# Patient Record
Sex: Male | Born: 1935
Health system: Southern US, Community
[De-identification: ages and names within clinical notes are randomized; demographics above are authoritative.]

## PROBLEM LIST (undated history)

## (undated) DIAGNOSIS — I2510 Atherosclerotic heart disease of native coronary artery without angina pectoris: Secondary | ICD-10-CM

## (undated) DIAGNOSIS — M545 Low back pain, unspecified: Secondary | ICD-10-CM

## (undated) DIAGNOSIS — I10 Essential (primary) hypertension: Secondary | ICD-10-CM

## (undated) DIAGNOSIS — Z87442 Personal history of urinary calculi: Secondary | ICD-10-CM

## (undated) DIAGNOSIS — K219 Gastro-esophageal reflux disease without esophagitis: Secondary | ICD-10-CM

## (undated) DIAGNOSIS — H9319 Tinnitus, unspecified ear: Secondary | ICD-10-CM

## (undated) DIAGNOSIS — N451 Epididymitis: Secondary | ICD-10-CM

## (undated) DIAGNOSIS — R609 Edema, unspecified: Secondary | ICD-10-CM

## (undated) DIAGNOSIS — M1990 Unspecified osteoarthritis, unspecified site: Secondary | ICD-10-CM

## (undated) DIAGNOSIS — E7800 Pure hypercholesterolemia, unspecified: Secondary | ICD-10-CM

## (undated) DIAGNOSIS — R600 Localized edema: Secondary | ICD-10-CM

## (undated) DIAGNOSIS — K911 Postgastric surgery syndromes: Secondary | ICD-10-CM

## (undated) DIAGNOSIS — K439 Ventral hernia without obstruction or gangrene: Secondary | ICD-10-CM

## (undated) DIAGNOSIS — K5790 Diverticulosis of intestine, part unspecified, without perforation or abscess without bleeding: Secondary | ICD-10-CM

## (undated) DIAGNOSIS — I219 Acute myocardial infarction, unspecified: Secondary | ICD-10-CM

## (undated) DIAGNOSIS — L309 Dermatitis, unspecified: Secondary | ICD-10-CM

## (undated) DIAGNOSIS — D126 Benign neoplasm of colon, unspecified: Secondary | ICD-10-CM

## (undated) DIAGNOSIS — R1310 Dysphagia, unspecified: Secondary | ICD-10-CM

## (undated) DIAGNOSIS — N529 Male erectile dysfunction, unspecified: Secondary | ICD-10-CM

## (undated) DIAGNOSIS — M5090 Cervical disc disorder, unspecified, unspecified cervical region: Secondary | ICD-10-CM

## (undated) DIAGNOSIS — N400 Enlarged prostate without lower urinary tract symptoms: Secondary | ICD-10-CM

## (undated) DIAGNOSIS — N200 Calculus of kidney: Secondary | ICD-10-CM

## (undated) DIAGNOSIS — Z9889 Other specified postprocedural states: Secondary | ICD-10-CM

## (undated) HISTORY — PX: ANTRECTOMY: SHX5722

## (undated) HISTORY — PX: BACK SURGERY: SHX140

## (undated) HISTORY — DX: Tinnitus, unspecified ear: H93.19

## (undated) HISTORY — DX: Postgastric surgery syndromes: K91.1

## (undated) HISTORY — DX: Benign neoplasm of colon, unspecified: D12.6

## (undated) HISTORY — DX: Low back pain: M54.5

## (undated) HISTORY — DX: Unspecified osteoarthritis, unspecified site: M19.90

## (undated) HISTORY — DX: Diverticulosis of intestine, part unspecified, without perforation or abscess without bleeding: K57.90

## (undated) HISTORY — DX: Localized edema: R60.0

## (undated) HISTORY — DX: Other specified postprocedural states: Z98.890

## (undated) HISTORY — PX: OTHER SURGICAL HISTORY: SHX169

## (undated) HISTORY — DX: Dysphagia, unspecified: R13.10

## (undated) HISTORY — DX: Benign prostatic hyperplasia without lower urinary tract symptoms: N40.0

## (undated) HISTORY — DX: Low back pain, unspecified: M54.50

## (undated) HISTORY — DX: Calculus of kidney: N20.0

## (undated) HISTORY — DX: Ventral hernia without obstruction or gangrene: K43.9

## (undated) HISTORY — DX: Cervical disc disorder, unspecified, unspecified cervical region: M50.90

## (undated) HISTORY — DX: Male erectile dysfunction, unspecified: N52.9

## (undated) HISTORY — PX: CHOLECYSTECTOMY: SHX55

## (undated) HISTORY — PX: HEMORRHOID SURGERY: SHX153

## (undated) HISTORY — DX: Epididymitis: N45.1

## (undated) HISTORY — DX: Dermatitis, unspecified: L30.9

## (undated) HISTORY — PX: CERVICAL DISC SURGERY: SHX588

## (undated) HISTORY — DX: Atherosclerotic heart disease of native coronary artery without angina pectoris: I25.10

## (undated) HISTORY — DX: Pure hypercholesterolemia, unspecified: E78.00

## (undated) HISTORY — DX: Edema, unspecified: R60.9

## (undated) HISTORY — PX: APPENDECTOMY: SHX54

## (undated) HISTORY — PX: TONSILLECTOMY: SUR1361

## (undated) HISTORY — DX: Essential (primary) hypertension: I10

---

## 1998-09-20 ENCOUNTER — Inpatient Hospital Stay (HOSPITAL_COMMUNITY): Admission: EM | Admit: 1998-09-20 | Discharge: 1998-09-23 | Payer: Self-pay | Admitting: Emergency Medicine

## 2000-07-11 ENCOUNTER — Ambulatory Visit (HOSPITAL_COMMUNITY): Admission: RE | Admit: 2000-07-11 | Discharge: 2000-07-11 | Payer: Self-pay | Admitting: Family Medicine

## 2000-07-11 ENCOUNTER — Encounter: Payer: Self-pay | Admitting: Family Medicine

## 2001-08-11 ENCOUNTER — Encounter (INDEPENDENT_AMBULATORY_CARE_PROVIDER_SITE_OTHER): Payer: Self-pay | Admitting: Specialist

## 2001-08-11 ENCOUNTER — Ambulatory Visit (HOSPITAL_COMMUNITY): Admission: RE | Admit: 2001-08-11 | Discharge: 2001-08-11 | Payer: Self-pay | Admitting: Gastroenterology

## 2005-04-02 ENCOUNTER — Encounter: Admission: RE | Admit: 2005-04-02 | Discharge: 2005-04-04 | Payer: Self-pay | Admitting: Orthopedic Surgery

## 2005-05-08 ENCOUNTER — Ambulatory Visit (HOSPITAL_COMMUNITY): Admission: RE | Admit: 2005-05-08 | Discharge: 2005-05-08 | Payer: Self-pay | Admitting: Orthopedic Surgery

## 2005-07-30 ENCOUNTER — Encounter (INDEPENDENT_AMBULATORY_CARE_PROVIDER_SITE_OTHER): Payer: Self-pay | Admitting: *Deleted

## 2005-07-30 ENCOUNTER — Ambulatory Visit (HOSPITAL_COMMUNITY): Admission: RE | Admit: 2005-07-30 | Discharge: 2005-07-30 | Payer: Self-pay | Admitting: Gastroenterology

## 2005-11-22 ENCOUNTER — Ambulatory Visit (HOSPITAL_COMMUNITY): Admission: RE | Admit: 2005-11-22 | Discharge: 2005-11-22 | Payer: Self-pay | Admitting: Orthopedic Surgery

## 2006-05-12 ENCOUNTER — Encounter: Admission: RE | Admit: 2006-05-12 | Discharge: 2006-05-12 | Payer: Self-pay | Admitting: Gastroenterology

## 2006-05-13 ENCOUNTER — Encounter: Admission: RE | Admit: 2006-05-13 | Discharge: 2006-05-13 | Payer: Self-pay | Admitting: Gastroenterology

## 2006-05-29 ENCOUNTER — Inpatient Hospital Stay (HOSPITAL_COMMUNITY): Admission: RE | Admit: 2006-05-29 | Discharge: 2006-05-30 | Payer: Self-pay | Admitting: Neurological Surgery

## 2006-06-24 ENCOUNTER — Encounter: Admission: RE | Admit: 2006-06-24 | Discharge: 2006-06-24 | Payer: Self-pay | Admitting: Neurological Surgery

## 2006-08-18 ENCOUNTER — Encounter: Admission: RE | Admit: 2006-08-18 | Discharge: 2006-08-18 | Payer: Self-pay | Admitting: Neurological Surgery

## 2007-05-13 ENCOUNTER — Encounter: Admission: RE | Admit: 2007-05-13 | Discharge: 2007-05-13 | Payer: Self-pay | Admitting: Gastroenterology

## 2007-06-01 ENCOUNTER — Encounter: Admission: RE | Admit: 2007-06-01 | Discharge: 2007-06-01 | Payer: Self-pay | Admitting: Family Medicine

## 2007-12-10 HISTORY — PX: OTHER SURGICAL HISTORY: SHX169

## 2008-03-08 ENCOUNTER — Encounter: Admission: RE | Admit: 2008-03-08 | Discharge: 2008-03-08 | Payer: Self-pay | Admitting: Neurological Surgery

## 2008-04-08 ENCOUNTER — Encounter: Admission: RE | Admit: 2008-04-08 | Discharge: 2008-04-08 | Payer: Self-pay | Admitting: Neurological Surgery

## 2008-04-22 ENCOUNTER — Observation Stay (HOSPITAL_COMMUNITY): Admission: RE | Admit: 2008-04-22 | Discharge: 2008-04-24 | Payer: Self-pay | Admitting: Neurological Surgery

## 2008-05-03 ENCOUNTER — Encounter: Admission: RE | Admit: 2008-05-03 | Discharge: 2008-05-03 | Payer: Self-pay | Admitting: Neurological Surgery

## 2008-05-16 ENCOUNTER — Encounter: Admission: RE | Admit: 2008-05-16 | Discharge: 2008-06-22 | Payer: Self-pay | Admitting: Neurological Surgery

## 2008-07-26 ENCOUNTER — Encounter: Admission: RE | Admit: 2008-07-26 | Discharge: 2008-07-26 | Payer: Self-pay | Admitting: Neurological Surgery

## 2008-09-11 ENCOUNTER — Ambulatory Visit (HOSPITAL_COMMUNITY): Admission: RE | Admit: 2008-09-11 | Discharge: 2008-09-11 | Payer: Self-pay | Admitting: Neurological Surgery

## 2008-09-14 ENCOUNTER — Encounter: Admission: RE | Admit: 2008-09-14 | Discharge: 2008-09-14 | Payer: Self-pay | Admitting: Neurological Surgery

## 2008-09-23 ENCOUNTER — Encounter: Admission: RE | Admit: 2008-09-23 | Discharge: 2008-09-23 | Payer: Self-pay | Admitting: Neurological Surgery

## 2008-10-05 ENCOUNTER — Inpatient Hospital Stay (HOSPITAL_COMMUNITY): Admission: RE | Admit: 2008-10-05 | Discharge: 2008-10-31 | Payer: Self-pay | Admitting: Neurological Surgery

## 2008-10-22 ENCOUNTER — Ambulatory Visit: Payer: Self-pay | Admitting: Infectious Diseases

## 2008-11-21 ENCOUNTER — Encounter: Admission: RE | Admit: 2008-11-21 | Discharge: 2008-11-21 | Payer: Self-pay | Admitting: Neurological Surgery

## 2008-11-29 ENCOUNTER — Ambulatory Visit: Payer: Self-pay | Admitting: Internal Medicine

## 2008-11-29 DIAGNOSIS — Q762 Congenital spondylolisthesis: Secondary | ICD-10-CM | POA: Insufficient documentation

## 2008-11-29 DIAGNOSIS — R300 Dysuria: Secondary | ICD-10-CM | POA: Insufficient documentation

## 2008-11-29 DIAGNOSIS — T8140XA Infection following a procedure, unspecified, initial encounter: Secondary | ICD-10-CM

## 2008-11-29 DIAGNOSIS — Z9889 Other specified postprocedural states: Secondary | ICD-10-CM

## 2008-11-29 DIAGNOSIS — IMO0002 Reserved for concepts with insufficient information to code with codable children: Secondary | ICD-10-CM

## 2008-12-12 ENCOUNTER — Telehealth: Payer: Self-pay | Admitting: Internal Medicine

## 2009-01-12 ENCOUNTER — Ambulatory Visit: Payer: Self-pay | Admitting: Internal Medicine

## 2009-01-12 LAB — CONVERTED CEMR LAB
CRP: 0.2 mg/dL (ref <0.6)
Sed Rate: 37 mm/hr — ABNORMAL HIGH (ref 0–16)

## 2009-02-06 ENCOUNTER — Encounter: Admission: RE | Admit: 2009-02-06 | Discharge: 2009-02-06 | Payer: Self-pay | Admitting: Neurological Surgery

## 2009-03-14 ENCOUNTER — Ambulatory Visit: Payer: Self-pay | Admitting: Internal Medicine

## 2009-03-14 LAB — CONVERTED CEMR LAB
CRP: 0.1 mg/dL (ref <0.6)
Sed Rate: 15 mm/hr (ref 0–16)

## 2009-04-10 ENCOUNTER — Encounter: Admission: RE | Admit: 2009-04-10 | Discharge: 2009-04-10 | Payer: Self-pay | Admitting: Neurological Surgery

## 2009-07-10 ENCOUNTER — Encounter: Admission: RE | Admit: 2009-07-10 | Discharge: 2009-07-10 | Payer: Self-pay | Admitting: Neurological Surgery

## 2010-02-25 IMAGING — RF DG LUMBAR SPINE 2-3V
1 series · 2 of 2 positions shown · non-contrast
Comparison: CT myelogram 09/23/2008

CLINICAL DATA: Back pain

LUMBAR SPINE - 2-3 VIEW

[Series 1: run · 2 of 2 slices shown]
[im 1/2]
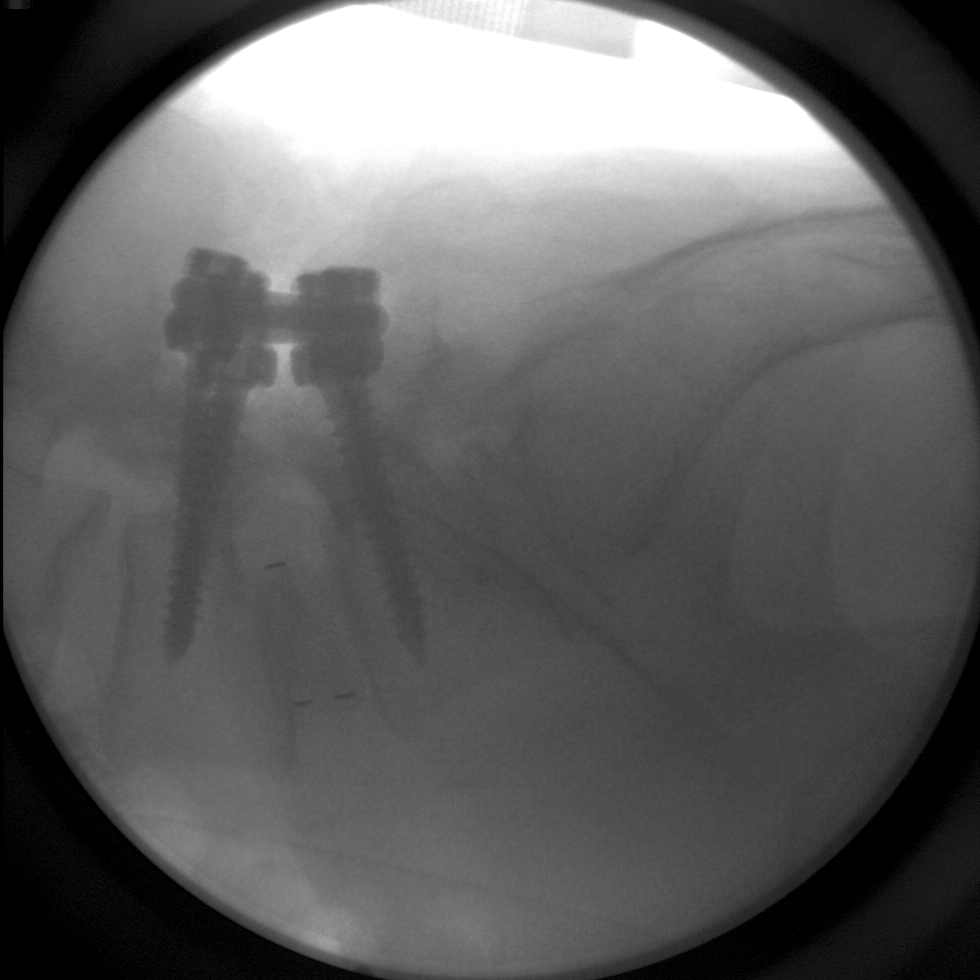
[im 2/2]
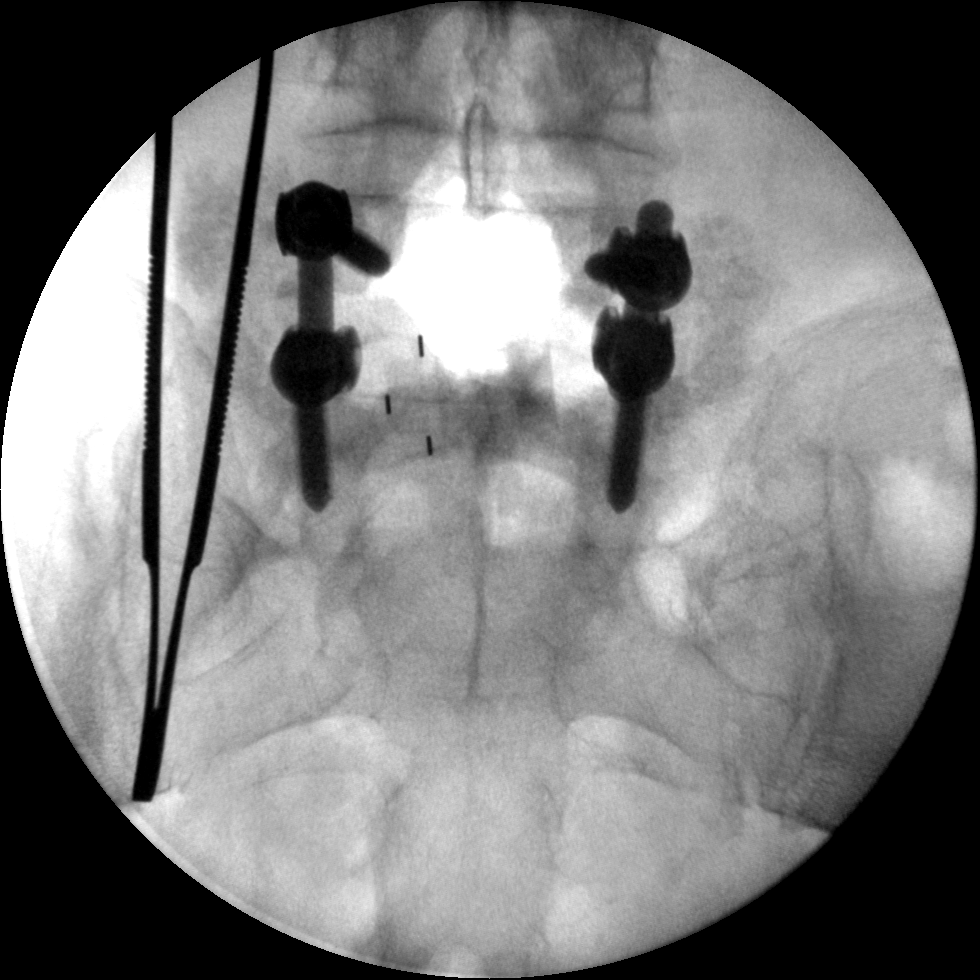

[2 of 2 positions shown; findings below may reference images not displayed]

FINDINGS: AP and lateral intraoperative spot films demonstrate L4-5
PLIF.  Satisfactory position and alignment.
IMPRESSION: As above

## 2010-03-06 IMAGING — RF DG MYELOGRAM LUMBAR
12 series · 12 of 12 positions shown · non-contrast
Comparison: 09/23/2008.
COMPARISON: The present examination incorporates from the mid T8 to
the sacrum.

CLINICAL DATA: History of fusion L4-5.  Dural leak which has been
repaired but with continued drainage and headache.
TECHNIQUE: CT imaging of the lumbar spine was performed after
intrathecal contrast administration.  Multiplanar CT image
reconstructions were also generated.

[Series 1: run · 1 of 1 slices shown (1 of 12)]
[im 1/1]
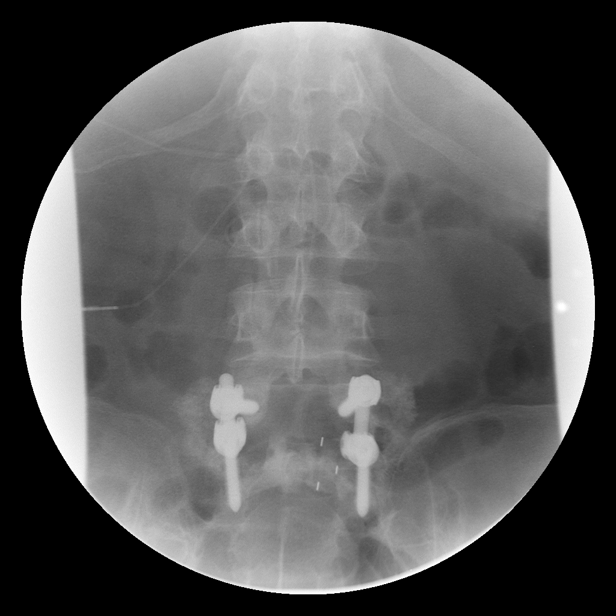

[Series 2: run · 1 of 1 slices shown (2 of 12)]
[im 1/1]
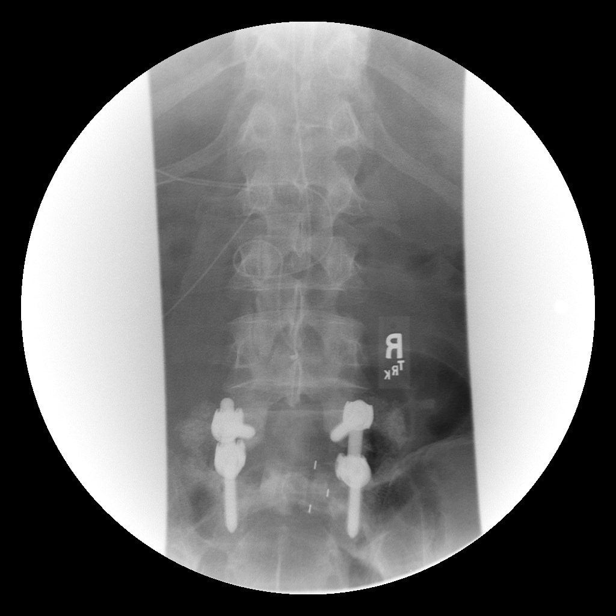

[Series 3: run · 1 of 1 slices shown (3 of 12)]
[im 1/1]
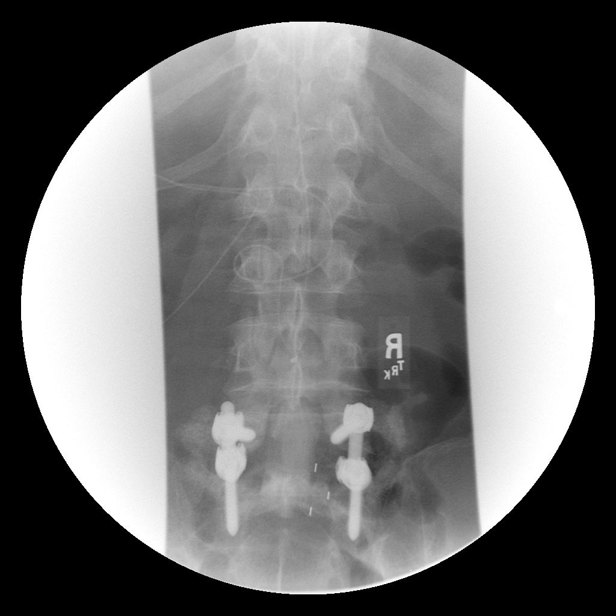

[Series 4: run · 1 of 1 slices shown (4 of 12)]
[im 1/1]
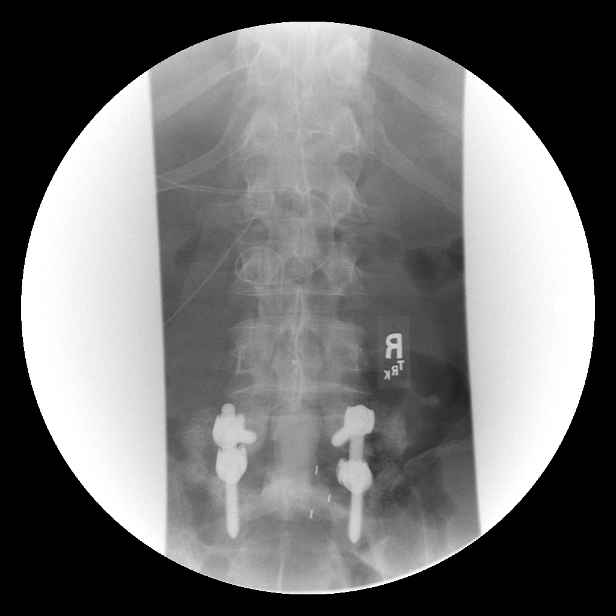

[Series 5: run · 1 of 1 slices shown (5 of 12)]
[im 1/1]
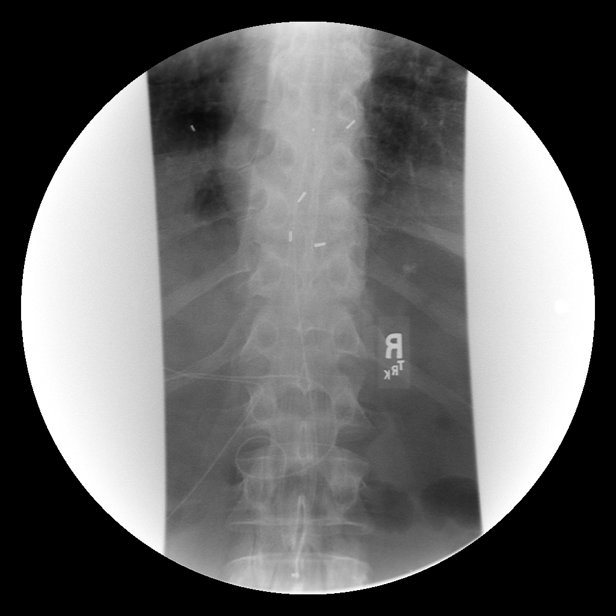

[Series 6: run · 1 of 1 slices shown (6 of 12)]
[im 1/1]
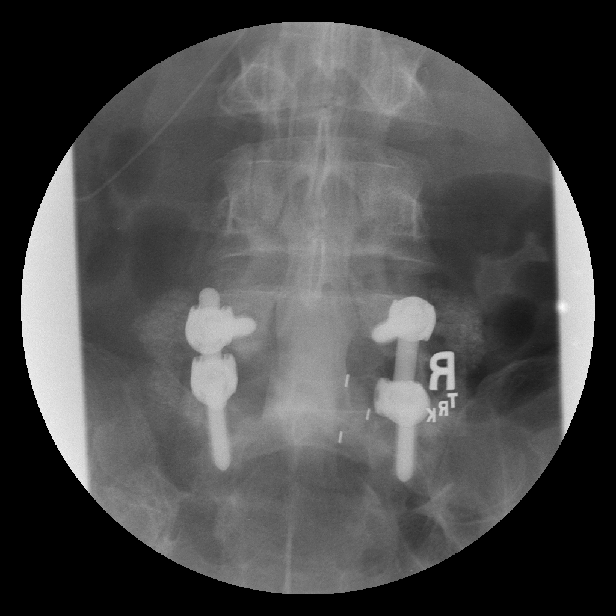

[Series 7: run · 1 of 1 slices shown (7 of 12)]
[im 1/1]
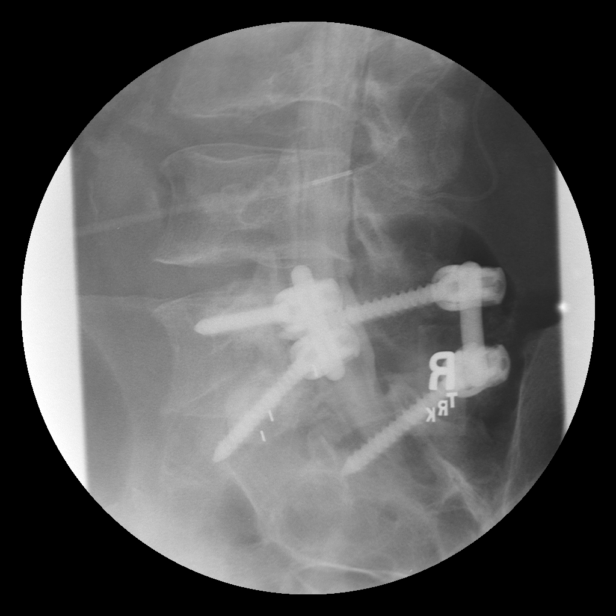

[Series 8: run · 1 of 1 slices shown (8 of 12)]
[im 1/1]
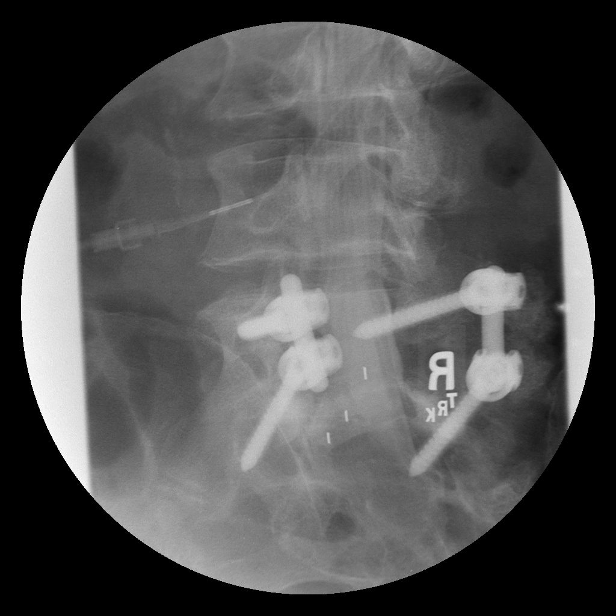

[Series 9: run · 1 of 1 slices shown (9 of 12)]
[im 1/1]
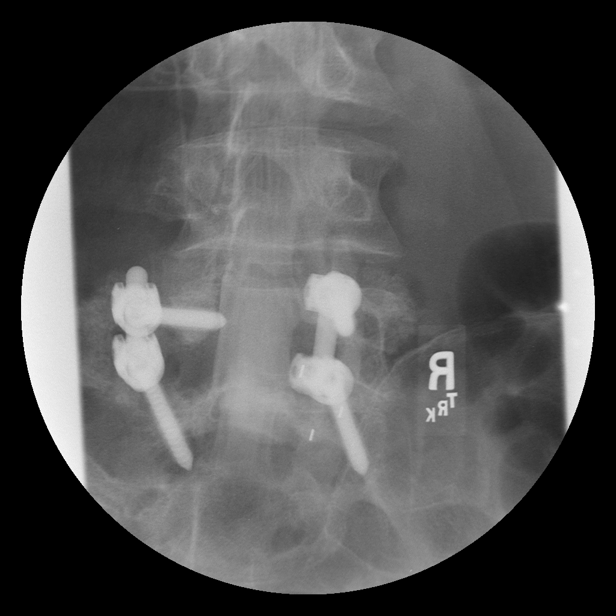

[Series 10: run · 1 of 1 slices shown (10 of 12)]
[im 1/1]
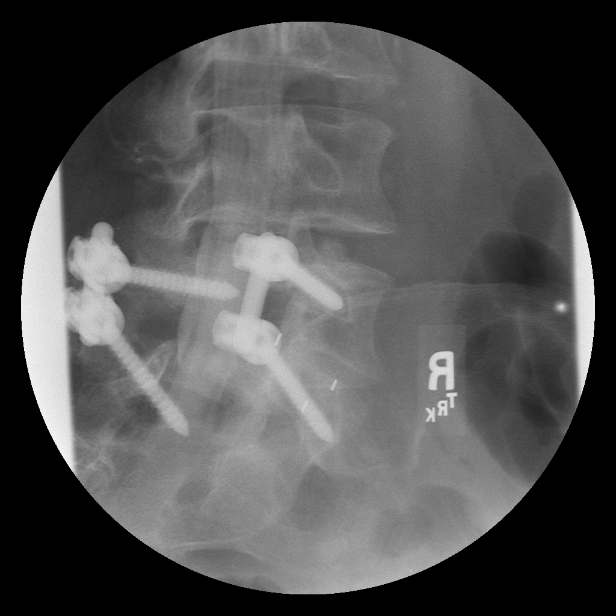

[Series 11: run · 1 of 1 slices shown (11 of 12)]
[im 1/1]
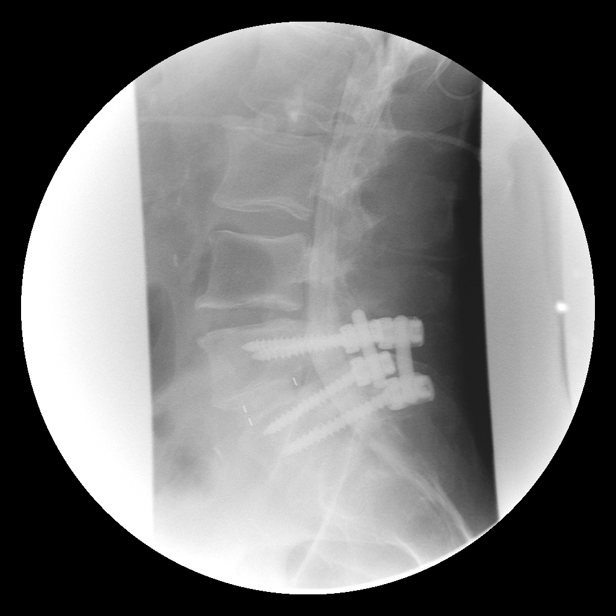

[Series 12: run · 1 of 1 slices shown (12 of 12)]
[im 1/1]
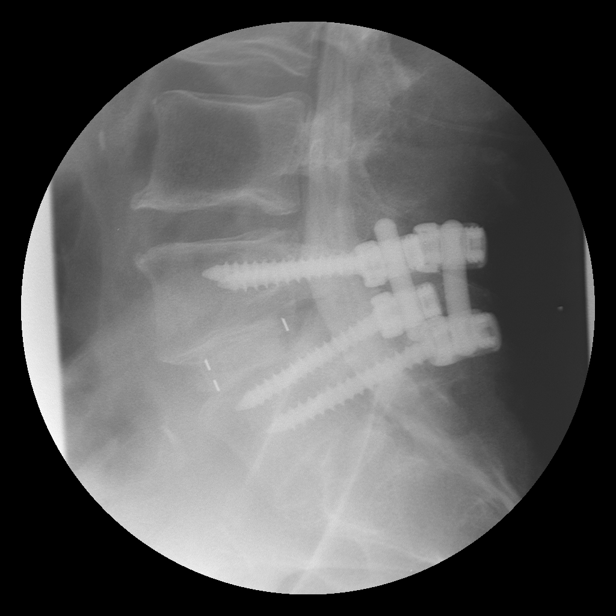

[12 of 12 positions shown; findings below may reference images not displayed]

MYELOGRAM LUMBAR

Procedure:  The procedure was requested by Dr. Albina and discussed
with Dr. Bale.  Subsequently, Dr. Hans was contacted on call by
myself to determine that myelogram was necessary given the fact
that the patient is on Reglan and Phenergan.  Dr. Hans felt this
was an emergent study and had to be performed at the current time.
Additionally, it was confirmed that it was neurosurgery's request
for instillation of contrast to be through the cerebral spinal
fluid drainage catheter.  This was felt to be a close system and
sterile per Dr. Hans.

The procedure and associated risks were reviewed with the patient.
All questions were answered.  Written and oral witnessed consent
was obtained. The patients drainage catheter site was prepped and
draped in a sterile fashion.  6 ml of Omnipaque 180 was instilled.
The patient was kept in a 30 degrees head up position and
transported to CT.  No immediate complications.
FINDINGS: Fusion L4-5 with bilateral pedicle screws, posterior bar
and interbody fusion device in place.  Cerebral spinal fluid
catheter is in place entering between the L2 and L3 spinous process
directed superiorly reaching the T8 level.  No discernible leak
identified on the myelogram.  Please see post CT report.
IMPRESSION: Leak is not identified.  Please see below.

CT MYELOGRAPHY LUMBAR SPINE
There is a transitional appearance of the L5 vertebral
body.  Surgeries was formed at the L4-5 level as discussed below.

Incidental findings include:  Coronary artery calcifications.
Prior vagotomy.  Slightly thickened distal esophagus. This may
represent changes mild esophagitis.  Calcified right adrenal gland
with 1.6 cm fatty lesion.  Nonobstructing small right renal stone.
Calcified ectatic aorta.  The portion visualized does not appear to
be aneurysmally dilated.

Conus upper L1 level.  Cerebrospinal fluid drainage catheter enters
between the L2 and L3 spinous process and traverses superiorly to
the lower T8 level.  The drainage catheter extends through a
pseudomeningocele which surrounds the L2 and L3 spinous process.
There is contrast within this pseudomeningocele as well as a small
amount of air.  At the entrance site, the catheter does not have
the surrounding contrast and I suspect this is not the source of
patient's persistent headache/leak.  Please see below.

T8-T9:  Very small left posterior lateral disc protrusion with
slight impression on the thecal sac.

T9-T10:  Mild posterior element hypertrophy left greater right.

T10-11 through T12-L1 unremarkable.

L1-2:  Mild bulge.

L2-3:  Mild bulge.  Mild bilateral facet joint degenerative
changes.  Mild ligament flavum hypertrophy.  Mild spinal stenosis.

L3-4:  Bulge.  Facet joint degenerative changes.  L4 laminectomy.

L4-5:  Status post fusion with bilateral pedicle screws, posterior
connecting bar and interbody fusion.  These appear in appropriate
position.  There is minimal anterior slip of L4-L5.  Low density
material may be related to operative repair material.  This limits
detection for infection.  Contrast material in the epidural region
originates at the lower L3 level and extends inferiorly and
posteriorly into the pseudomeningocele.  There is slight
irregularity of the thecal sac on the right at the L3-4 level.  It
is possible this represents the source of this leak.  Limited
evaluation of the L4-5 neural foramen secondary to that changes.

L5-L1:  Rudimentary disc.
IMPRESSION: In this patient who has had a recent surgery at L4-5 level, there
is evidence of leak of cerebral spinal fluid.  This may originate
from the dural sac possibly right aspect of the dural sac at the L3-
4 level.  This does not appear to originate from the cerebral
spinal fluid drainage catheter as discussed above.

Report called to Amazigh,  patient's nurse 10/15/2007 8pm

## 2011-04-23 NOTE — Op Note (Signed)
Carlos Navarro, Carlos Navarro                  ACCOUNT NO.:  [id number]   MEDICAL RECORD NO.:  [id number]          PATIENT TYPE:  INP   LOCATION:                               FACILITY:  [facility]   PHYSICIAN:  Tia Alert, MD     DATE OF BIRTH:  1936-10-19   DATE OF PROCEDURE:  10/20/2008  DATE OF DISCHARGE:                               OPERATIVE REPORT   PREOPERATIVE DIAGNOSES:  Suspected epidural abscess, status post L4-L5,  posterior lumbar interbody fusion.   POSTOPERATIVE DIAGNOSES:  Suspected epidural abscess, status post L4-L5,  posterior lumbar interbody fusion.   PROCEDURE:  Irrigation and debridement of lumbar wound with evacuation  of epidural abscess and exploration of previous pseudomeningocele.   SURGEON:  Tia Alert, MD   ANESTHESIA:  General endotracheal.   COMPLICATIONS:  None apparent.   INDICATIONS FOR PROCEDURE:  Carlos Navarro is a pleasant 75 year old gentleman  who underwent a posterior lumbar interbody fusion about 2 weeks ago.  Unfortunately, he suffered an unintended durotomy.  At the time of the  surgery, he was taken back to the operating room several days after  surgery for repair of a CSF leak and placement of a lumbar drain.  The  lumbar drain remained in place for 8 days prior to being pulled.  Two  days ago, he initially did well, but last night developed fever and  bilateral leg pain.  This morning, he had some drainage from his  incision that appeared to be purulent.  Most of it was coming from the  old lumbar drain hole, but very small amount was coming from the  incision itself.  I suspected that he had a deep wound infection.  I got  an MRI with and without contrast that showed a large epidural fluid  collection.  I recommended an emergent lumbar exploration for evacuation  of fluid collection.  This was unclear whether this was going to be a  pseudomeningocele with super infection or epidural abscess.  He  understood the risks, benefits, expected  outcome, and wished to proceed.   FINDINGS AT SURGERY:  The patient was found to have significant amounts  of liquid epidural abscess extending from the super fascial tissues all  the way down to the dura.  I ends inspected the old CSF leak and found  very little evidence of continued CSF leakage from the repair site,  therefore, I lifted the muscle, I tacked to the dura and valsalved the  patient, there was a small amount of CSF flow, but he had good turgor to  his dura suggesting minimal or no leak.  He also had no evidence of leak  with Valsalva up to 40 three different times in the case unless there  was pressure put on the dura itself.  Therefore, I suspect he has a  healing dural tear with minimal or no CSF leak.   DESCRIPTION OF PROCEDURE:  The patient was taken to the operating room.  After induction of adequate generalized endotracheal anesthesia, he was  rolled in prone position on chest rolls and  all pressure points were  padded.  His lumbar region was prepped with DuraPrep and then draped in  usual sterile fashion.  A 5 mL of local anesthesia was injected and his  old incision was opened with immediate release of purulent fluid.  I  removed the old sutures, opened the fascia and the muscle, and there was  obvious epidural abscess under pressure.  This was liquefied and was  removed easily with suction.  I removed all the old Gelfoam and Tisseel  fibrin glue.  I inspected for CSF leak, had the patient valsalved up to  40, saw no evidence of CSF leak at the old unintended durotomy site.  I  then used 2.5 L of bacitracin containing saline solution with a Pulsavac  and used pulsatile lavage to clean the wound.  The wound then quite  clean, I inspected again.  The CSF had normal turgor.  If I pushed down  on the dura and had the patient valsalved up to 40.  I could see a small  drop with the spinal fluid coming out from under the muscle that I  tacked down to the dura at the last  repair.  Therefore, I considered  another primary closure.  However, his dura is very thin and I was  afraid that I would make matters worse by trying to sew more suture into  the dura; therefore, I decided to take another piece of muscle, wrap it  in Surgicel, and lay this over the previous repair site.  Also used  Surgifoam which is likely with Gelfoam and used it to infiltrate the  area in order to get into the small crevices, in which solid Gelfoam  would not be at a reach.  I then lined the dura with Gelfoam and then I  used Tisseel fibrin glue.  I then closed the muscle and the fascia with  interrupted 0 Vicryl.  I placed a suprafascial Hemovac drain, not  feeling very comfortable, so I put a subfascial drain on top of the  dura, because this could stimulate pseudomeningocele formation.  Therefore, I placed a super fascial drain, did not sew the fascia very  tightly.  I then closed the subcutaneous subcuticular sutures with 2-0  and 3-0 Vicryl and closed the skin with a running 4-0 Ethilon suture.  Sterile dressings were applied.  The patient was then awakened from  general anesthesia and transferred to recovery room in stable condition.  Intraoperative cultures were sent for anaerobic and anaerobic cultures  and he was given vancomycin and Rocephin intraoperatively after the  cultures were sent.  At the end of the procedure all sponge, needle, and  sponge counts were correct.      Tia Alert, MD  Electronically Signed     DSJ/MEDQ  D:  10/20/2008  T:  10/21/2008  Job:  [id number]

## 2011-04-23 NOTE — Op Note (Signed)
Carlos Navarro, Carlos Navarro                  ACCOUNT NO.:  [id number]   MEDICAL RECORD NO.:  [id number]          PATIENT TYPE:  INP   LOCATION:  3034                         FACILITY:  [facility]   PHYSICIAN:  Tia Alert, MD     DATE OF BIRTH:  August 31, 1936   DATE OF PROCEDURE:  DATE OF DISCHARGE:                               OPERATIVE REPORT   PREOPERATIVE DIAGNOSES:  1. Spondylolisthesis, L4-L5.  2. Lumbar disk herniation L4-L5 with a free fragment extending under      the L4 nerve root.  3. Facet arthropathy, L4-L5.  4. Back pain.  5. Left leg pain.   POSTOPERATIVE DIAGNOSES:  1. Spondylolisthesis, L4-L5.  2. Lumbar disk herniation at L4-L5 with a free fragment extending      under the L4 nerve roots.  3. Facet arthropathy, L4-L5.  4. Back pain.  5. Left leg pain.   PROCEDURE:  1. Decompressive lumbar laminectomy, hemifacetectomy and      foraminotomies at L4-L5, bilateral release and decompression of      both the L4 and the L5 nerve root requiring more work than is      typically required in the postprocedure with undercutting of the L3-      L4 interspace for decompression of the L4 nerve roots.  2. Posterior lumbar interbody fusion at L4-L5 utilizing a 12 x 22 mm      PEEK interbody cage packed with local autograft and Actifuse and a      12 x 22 mm Tangent interbody bone wedge.  The midline was packed      with local autograft and Actifuse putty and Osteocel Plus.  3. Non-segmental fixation at L4-L5 utilizing the Legacy pedicle screw      system.  4. Intertransverse arthrodesis at L4-L5 utilizing local autograft      Actifuse putty and Osteocel.   SURGEON:  Dr. Tia Alert, MD.   ASSISTANT:  Donalee Citrin, M.D.   ANESTHESIA:  General endotracheal.   COMPLICATIONS:  Small dural tear repaired primarily.   INDICATIONS FOR THE PROCEDURE:  Mr. Sainsbury is a very pleasant 75 year old  gentleman who presented with severe left leg pain with acute onset.  He  tried medical  management including epidural steroid injection without  relief.  He had an MRI and a CT myelogram, which showed  spondylolisthesis at L4-L5 with foraminal stenosis, left greater than  right.  I recommended a lumbar decompression and instrumented fusion to  address both the segmental instability and the disk herniation in the  foraminal stenosis.  He understood the risk, benefits, and expected  outcome and wished to proceed.   DESCRIPTION OF PROCEDURE:  The patient was taken to the operating room  and after induction of adequate generalized endotracheal anesthesia, he  was rolled into the prone position on chest rolls and all pressure  points were padded.  His lumbar region was prepped with DuraPrep and  then draped in the usual sterile fashion.  A 10 mL of local anesthesia  was injected and a small dorsal midline incision was made and carried  down through the lumbosacral fascia.  The fascia was opened and the  paraspinous musculature was taken down in a subperiosteal fashion to  expose L4-L5 bilaterally.  Intraoperative fluoroscopy confirmed our  level.  We confirmed this at least 3 different times because of his  transitional anatomy at L5-S1, but he had an obvious spondylolisthesis  at this level.  Therefore, we felt good about our level, we removed his  spinous process and then used Kerrison punches to perform a complete  laminectomy, hemifacetectomy, and foraminotomies at L4-L5.  He had  significant lateral recess stenosis at L3-L4.  Therefore, I undercut the  lamina of L4 and decompressed removing yellow ligament until I could  identify the disk space.  After we marched along the L4 nerve roots to  decompress them distally into the foramina, we marched along the L5  nerve roots to decompress them distally at the respective foramina and  performed a hemifacetectomy to prepare for posterior lumbar interbody  fusion.  We incised the disk space bilaterally.  After coagulating the   epidural venous vasculature, we found a large free fragment underneath  the L4 nerve root on the left and felt like this was the asymptomatic  lesion.  We then distracted the disk space up to 12 mm.  We then used a  rotating cutter, 12-mm cutting chisel, and curettes to prepare the disk  space for arthrodesis and a complete diskectomy was performed.  We  placed a 12 x 26 mm Tangent interbody bone wedge on the patient's left  side, a 12 x 26 mm PEEK interbody cage packed with local autograft and  Actifuse on the patient's right side.  The midline was packed with local  autograft and Actifuse and Osteocel plus.  Once our PLIF was complete,  we turned our attention to the pedicle screw fixation.  We localized the  pedicle screw entry zones utilizing surface landmarks and lateral  fluoroscopy.  We probed each pedicle with a pedicle probe, however,  tapping the L4 pedicle on the right, the pedicle split, the pedicle was  quite sclerotic.  I planned on placing the 65 x 45 mm pedicle screw.  Therefore, I was tapping with a 5.5-mm tap and this split the pedicle.  I ended up placing a 55 x 45 mm screw into the pedicle and decompressing  along the L4 nerve root.  I then placed the other screws by tapping with  a 4.5 tap, a 5.5 tap, and then a 6.5 tap, and then placing 65 x 45 mm  pedicle screws into the L4 pedicle on the left and the L5 pedicles  bilaterally.  These had a nice tight fit.  I checked it with a AP and  lateral fluoroscopy.  Unfortunately, while the scrub nurse was passing a  4.5 tap, the tap came out of the handle and fell on to the dura and  there was a tiny dural laceration superior to the L4 nerve root on the  right, this was repaired with 5-0 Prolene sutures.  I was uncomfortable  with the way the superior part of the pedicle was cracked at L4 on the  right side.  It did not go into the body of the pedicle. However, he has  very large pedicles and we felt like we could get a more  superior entry  point at an angle more inferior and get better approach.  Therefore, we  removed the screw.  We localized our entry point utilizing the lateral  fluoroscopy.  We probed the pedicle, we tapped the pedicle with a 4.5  tap, a 5.5 tap, and a 6.5 tap, and then placed a 65 x 50 mm pedicle  screw into the L4 pedicle on the left and then checked this with AP and  lateral fluoroscopy, it looked quite good, it felt tight, and we felt  good about the screw trajectory.  Therefore, we decorticated the  transverse processes and placed a mixture of local autograft and  Actifuse and Osteocel putty over these to perform intertransverse  arthrodesis at L4-L5.  We then placed lordotic rods into multiaxial  screw heads and locked these in position with locking caps and anti-  torque device after achieving compression of our grafts.  I then  irrigated with saline solution containing bacitracin, dried all bleeding  points, I inspected nerve roots again by passing coronary dilators along  the nerve roots to ensure adequate decompression.  I inspected the  construct once again.  I then aligned the dura with Gelfoam and Tisseel  fibrin glue.  I then closed the muscle and the fascia with 0-Vicryl,  closing the subcutaneous and subcuticular tissue with 2-0 and 3-0  Vicryl, and closed the skin with Benzoin and Steri-Strips.  A small  Hemovac  drain was placed prior to closure through a separate stab incision.  The  wound was dressed with occlusive dressing.  The patient was awakened  from general anesthesia and transferred to recovery room in stable  condition.  At the end of the procedure, all sponge, needle, and  instrument counts were correct.      Tia Alert, MD  Electronically Signed     DSJ/MEDQ  D:  10/05/2008  T:  10/06/2008  Job:  [id number]

## 2011-04-23 NOTE — Op Note (Signed)
NAMEJENNA, Carlos Navarro                  ACCOUNT NO.:  [id number]   MEDICAL RECORD NO.:  [id number]          PATIENT TYPE:  INP   LOCATION:                               FACILITY:  [facility]   PHYSICIAN:  Tia Alert, MD     DATE OF BIRTH:  [birth year]   DATE OF PROCEDURE:  10/10/2008  DATE OF DISCHARGE:                               OPERATIVE REPORT   PREOPERATIVE DIAGNOSIS:  Pseudomeningocele, status post posterior lumbar  interbody fusion L4-L5.   POSTOPERATIVE DIAGNOSIS:  Pseudomeningocele, status post posterior  lumbar interbody fusion L4-L5.   PROCEDURE:  Lumbar re-exploration with repair of pseudomeningocele and  placement of lumbar drain through a separate stab incision.   SURGEON:  Tia Alert, MD   ASSISTANT:  None.   ANESTHESIA:  General endotracheal.   COMPLICATIONS:  None apparent.   INDICATIONS FOR THE PROCEDURE:  Mr. Barrows is a very pleasant 75 year old  gentleman who underwent a posterior lumbar interbody fusion at L4-L5 on  October 05, 2008, unfortunately during the operation, one of the bone  taps came loose from its attachment to the ratcheting head and fell into  the dura creating a dural tear adjacent to the L4 nerve root with  several small slices in the dura, this was repaired primarily at the  time of surgery and layered with a Gelfoam and fibrin glue.  He was laid  flat for 2 days.  However, over the weekend he developed a  pseudomeningocele with significant headaches with nausea and vomiting.  I recommended a lumbar re-exploration with a repeat repair followed by  lumbar drain placement.  He understood the risks, benefits, expected  outcome, and wished to proceed.   DESCRIPTION OF PROCEDURE:  The patient was taken to operating room.  After induction of adequate generalized endotracheal anesthesia, he was  rolled in prone position on the Wilson frame and all pressure points  were padded.  His lumbar region was prepped with DuraPrep and draped in  usual  sterile fashion.  His old incision was opened with immediate  release of some spinal fluid.  I carried the dissection down through  soft tissues, identified the construct.  I tested the screws by pushing  and pulling and then they fell quite tight.  I identified the dura, it  had some turgor to it.  Therefore, I took a Tuohy needle between the L3-  L4 spinous processes.  I got to place this until I had good flow of CSF,  passed a lumbar drain through this and then removed the needle and had  good flow of CSF through the lumbar drain, therefore, I ran this through  a separate stab incision and sewed it into position.  I then turned my  attention to repairing the CSF leak.  I sucked away all the Gelfoam and  Tisseel fibrin glue, identified the dural tear.  There were too small  slits measuring a couple of millimeters in length.  I placed a 4-0  Nurolon through these and then took a piece of muscle and sewed this in  position  over the repair site using a 4-0 Nurolon suture.  I then lined  the dura with Gelfoam, prior to this, I tested with a Valsalva to make  sure I had no evidence of CSF leak.  There was none.  Therefore, I lined  the dura with Gelfoam and then Tisseel fibrin glue.  I then closed the  of muscle and the fascia with 0 Vicryl, closing the subcutaneous and  subcuticular tissue with 2-0 and 3-0 Vicryl and closed the skin with  Benzoin and Steri-Strips.  The drain was hooked to its distal reservoir  with good  flow of clear CSF.  I then placed an occlusive dressing and then the  patient was awakened from general anesthesia and transferred to the  recovery room in stable condition.  At the end of procedure, all sponge,  needle, and instrument counts were correct.      Tia Alert, MD  Electronically Signed     DSJ/MEDQ  D:  10/10/2008  T:  10/11/2008  Job:  [id number]

## 2011-04-23 NOTE — Discharge Summary (Signed)
NAMEJACQUESE, HACKMAN                  ACCOUNT NO.:  [id number]   MEDICAL RECORD NO.:  [id number]          PATIENT TYPE:  INP   LOCATION:  3025                         FACILITY:  [facility]   PHYSICIAN:  Tia Alert, MD     DATE OF BIRTH:  1936/05/08   DATE OF ADMISSION:  10/05/2008  DATE OF DISCHARGE:  10/31/2008                               DISCHARGE SUMMARY   ADMITTING DIAGNOSIS:  Grade 1 spondylolisthesis at L4-5 with spinal  stenosis with back and leg pain.   DISCHARGE DIAGNOSES:  1. Grade 1 spondylolisthesis at L4-5 with spinal stenosis with back      and leg pain.  2. Pseudomeningocele.  3. Wound infection.   PROCEDURES:  1. Posterior lumbar interbody fusion at L4-5.  2. Irrigation and debridement of lumbar wound with placement of a      lumbar drain.   BRIEF HISTORY OF PRESENT ILLNESS:  Mr. Carlos Navarro is a very pleasant 72-year-  old gentleman who was admitted on October 05, 2008 with a grade 1  spondylolisthesis at L4-5 with lateral recess stenosis.  He had back and  leg pain.  He had failed medical management, had severe leg pain,  recommended a posterior lumbar interbody fusion at L4-5.  He understood  the risks, benefits, expected outcome and wished to proceed.   HOSPITAL COURSE:  The patient was admitted on October 05, 2008, was  taken to the operating room where we underwent a posterior lumbar  interbody fusion at L4-5.  Unfortunately, the patient suffered an  intraoperative unintended durotomy.  He appeared to tolerated the  procedure well and was taken to recovery room and then to the floor in  stable condition.  For details of the operative procedure, please see  the dictated operative note.  The patient remained at flat bedrest for  the first 2 days.  He did have a Hemovac drain placed.  The Hemovac  drain seemed to put out CSF on the second postoperative day and  therefore, it was removed.  The patient did complain of severe headache  related to that.  He was kept at  flat bedrest however, and the following  day, he was leaking fluid from his incision and therefore, he was taken  back to the operating room on October 10, 2008 for repair of a  pseudomeningocele and placement of a lumbar drain.  He was then taken to  the neurosurgical ICU for further care.  The lumbar drain drained 10-12  mL/hour.  He was placed on Decadron for some postoperative leg pain.  His pain appeared to be well controlled after that.  He denied headache,  but he did have some burning and tingling in his leg and was started on  Neurontin, however, that calls flushing, so we stopped the Neurontin.  He remained on the lumbar drain until January 15, 2008, at which time,  we clamped the drain and allowed him out of bed.  Within a few hours, he  was leaking fluid from the drain and therefore, we did not know if this  was CSF or  serosanguineous fluid.  Therefore, he was taken for a CT  myelogram.  The myelogram was suggestive of continue spinal fluid leak  at the level and therefore, he was put back at bedrest with a lumbar  drain getting around 15 mL/hour.  He remained afebrile with stable vital  signs.  His dressing remained clean, dry, and intact.  He was drained  for four more days and at that point, felt that it was important to go  ahead and removed the drain as it had been for 8 days and there was  obviously a risk of infection with an indwelling catheter.  Therefore,  we removed the drain, kept him at flat bedrest and gave him bathroom  privileges on October 19, 2008.  On October 20, 2008, he had a  temperature spike to 100.6 and the incisions showed significant purulent  drainage just 36 hours after removal of the lumbar drain.  He was  started on vancomycin and Rocephin and taken back to the operating room  for irrigation and debridement of the lumbar wound.  This was done  emergently after an MRI showed a large epidural fluid collection.  This  turned out to be epidural  abscess and did not find evidence of CSF leak  at the time of the surgery.  He was put on vancomycin, Rocephin, and  Flagyl, put back in the ICU and a Hemovac drain was placed.  The Hemovac  was only left in for a couple of days because of our previous experience  with a Hemovac drain soaking spinal fluid therefore, it was removed.  The cultures came back sensitive showing sensitive staph aureus and  therefore, he was left on vancomycin and rifampin and the Rocephin and  the Flagyl were discontinued on October 22, 2008.  Unfortunately, on  October 23, 2008, he began to drain fluid from his wound.  Once again,  it was difficult to know if this was spinal fluid or serosanguineous  fluid from the infected tissues and early removal of the Hemovac.  I  recommended lumbar reexploration with repeat irrigation and debridement  of the wound and exploration for a pseudomeningocele.  I did not find  evidence of spinal fluid leak at the time of the surgery.  We did find  significant serosanguineous fluid.  I did place a lumbar drain and place  two medium Hemovac drains, the lumbar drain was mainly placed to enable  made to obtain CSF cultures and also to help me to drain him with  Hemovac for longer period of time.  Infectious Disease was consulted at  this time, they put him on Ancef 2 g IV q.8 h. along with the rifampin.  He was put in the ICU where the lumbar drain drained between 10 to 15 mL  an hour for several days with continued the antibiotics as first CSF  white count was over 400 seconds, CSF white count was over 1400, and  then the next day, it was down to 200 suggesting that we were having  nice treatment with the Ancef.  His incision remained clean, dry, and  intact.  He had some headache, but appeared to be tolerating the  treatment quite well.  He obviously had some meningeal irritation and  had some leg pain related to this, but as the infection cleared, he had  significant  improvement of his leg pain.  On October 26, 2008, he had  no headache.  He was doing well.  He had  no real leg pain.  His dressing  was clean, dry, and intact.  His lumbar drain was patent and his Hemovac  was still putting out fluid therefore, we continued Hemovac drainage.  He turned in bed and pulled out his lumbar drain this day and therefore,  we continued drainage through the Hemovac, continued his antibiotics,  but mobilize him and he did extremely well with mobilization.  He then  was then transferred to the floor and did extremely well through the  weekend and by October 31, 2008, he was afebrile with stable vital  signs.  His wound was clean, dry, and intact.  His Hemovac had minimal  output over 24-hour period from October 30, 2008 to October 31, 2008  and therefore, the drain was removed and the sutures were removed from  his incision and he was discharged home in stable condition on October 31, 2008 with plans to follow up me in 1 week.   DISCHARGE MEDICATIONS:  1. Percocet.  2. Flexeril.  3. Ancef 2 g IV q.8 h. for 5 weeks through home health.  4. Rifampin 300 mg p.o. b.i.d. for 5 weeks.  We will then plan on      continued oral antibiotics with Keflex for 6 weeks after that.   FINAL DIAGNOSES:  1. Posterior lumbar interbody fusion at L4-5.  2. Repair pseudomeningocele with placement of a lumbar drain.  3. Irrigation and debridement of the lumbar wound.      Tia Alert, MD  Electronically Signed     DSJ/MEDQ  D:  10/31/2008  T:  10/31/2008  Job:  [id number]

## 2011-04-23 NOTE — Op Note (Signed)
NAMESAWYER, Carlos Navarro                  ACCOUNT NO.:  [id number]   MEDICAL RECORD NO.:  [id number]          PATIENT TYPE:  INP   LOCATION:                               FACILITY:  [facility]   PHYSICIAN:  Tia Alert, MD     DATE OF BIRTH:  05/25/1936   DATE OF PROCEDURE:  10/23/2008  DATE OF DISCHARGE:                               OPERATIVE REPORT   PREOPERATIVE DIAGNOSES:  1. Lumbar wound infection.  2. Suspected cerebrospinal fluid leak.   POSTOPERATIVE DIAGNOSES:  1. Lumbar wound infection.  2. Possible pseudomeningocele.   PROCEDURES:  1. Lumbar re-exploration with exploration of old pseudomeningocele      repair site.  2. Irrigation and debridement of lumbar wound.  3. Placement of lumbar drain.   SURGEON:  Tia Alert, MD   ANESTHESIA:  General endotracheal.   COMPLICATIONS:  None apparent.   INDICATIONS FOR PROCEDURE:  Carlos Navarro is a 75 year old gentleman who  underwent a posterior lumbar interbody fusion about 3 weeks ago.  He had  an unintended durotomy at the time of the surgery.  He remained at flat  bedrest for couple of days, but then developed CSF leak postoperatively  and pseudomeningocele.  He was taken to the operating room for primary  repair of this and placement of lumbar drain after 8 days of lumbar  drainage.  The lumbar drain was removed.  He initially did well and then  had drainage from his wound which appeared to be purulent.  We took him  to the operating room and found him to have epidural abscess.  This was  removed and it grew staph aureus.  He has been on vancomycin and  rifampin.  He had a subfascial Hemovac drain in for about 36 hours  postoperatively, but this was removed in hopes of preventing a recurrent  pseudomeningocele, though we knew he would continue to perform  serosanguineous fluid because of the inflammation related to the lumbar  wound infection.  Two days later, he developed drainage from his wound  once again with severe  headache and pain in his legs.  I recommended  lumbar re-exploration suspected that he had a recurrent CSF leak.  He  understood the risks, benefits, and expected outcome, and wished to  proceed.  Certainly, we felt this could be serosanguineous fluid, though  felt like with these symptoms that he either had serosanguineous fluid  with meningeal irritation or return of CSF leak.   FINDINGS AT SURGERY:  I found no evidence of CSF leak at the time of  surgery.  I did find significant serosanguineous fluid.  I found no  purulence within the fluid.  The tissues would appeared to be somewhat  edematous.  I raised the head of the table to about 45 degrees and  Valsalva of the patient on multiple occasions to look for any CSF leak  at the old repair site, I found none.  I even inspected this under the  operating microscope and found no evidence of CSF leak.  The dura was  taut at the  time of surgery suggesting there was no evidence of leak or  if there was a leak, it was potentially a very slow leak, but he had  good CSF turgor.  No collapse of the dura whatsoever and I could not  find evidence of CSF leak intraoperatively.  The repair site looked  quite good.  Therefore, it was felt that this was likely serosanguineous  fluid, but because of his preoperative symptoms, I felt it was important  to get a spinal fluid sample.  I also could not rule out CSF leak  without some element of uncertainty; therefore, I thought it is best to  go ahead and place another lumbar drain at the time of this surgery due  to CSF sample and then that would allow me to place subfascial Hemovac  drains and super-fascial Hemovac drains to drain serosanguineous fluid  more aggressively for more days without risk of creating a recurrent  pseudomeningocele.  Therefore, the decision was made intraoperatively to  place a lumbar drain and also place a sub and super-fascial Hemovac  drains; however, the subfascial drain was  placed on top of the muscle,  but under the fascia and therefore was not lying on the dura itself.   DESCRIPTION OF PROCEDURE:  The patient taken to the operating room after  induction of adequate generalized endotracheal anesthesia.  He was  rolled into prone position on the Wilson frame and all pressure points  were padded.  His lumbar region was prepped with DuraPrep after removal  of the old suture line.  He was then draped in usual sterile fashion.  His old incision was opened with immediate release of a brownish liquid.  I continued to dissect down through the fascial layer to the muscular  layer, identified the dura by removing the old Gelfoam and Tisseel  fibrin glue.  I inspected the old suture line.  I removed the muscle  tissue out soon down on top of the dura.  I irrigated with saline  solution, I then inspected the dura for more than 30 minutes, and found  no evidence of CSF leak.  I continually pressed on the dura to see if I  could elicit a leak.  I raised the head of bed about 45 degrees,  Valsalva of the patient on multiple occasions likely up to 10 different  times to see if we can elicit CSF leak, and at no time, could I convince  myself that I saw CSF leaking from the old suture line.  I therefore  cleaned up all the wound edges.  I irrigated with copious amounts of  bacitracin-containing saline solution, aligned the old suture line with  Gelfoam once again, and then with Tisseel fibrin glue.  I also placed a  lumbar drain through the spinous processes at L2-L3 as done previously.  With good flow of very clear CSF, I sent CSF for CSF studies including  gram-stain and culture to make sure it did not have evidence of  meningitis.  I debrided the wound edges.  I then closed the muscle with  0 Vicryl.  I then placed a subfascial drain on top of the muscle, but a  good 2 inches above the dura.  I then closed the fascia with interrupted  0 Vicryl and placed a super-fascial  Hemovac drain.  I then closed the  subcuticular tissues with 2-0 and 3-0 Vicryl and closed the skin with a  running 3-0 Ethilon suture.  The drains were hooked  to the distal  reservoirs, and then a sterile dressing was applied, and the patient was  taken to the recovery room in stable condition after being awakened.  At  the end of the procedure, all sponge, needle, and instrument counts were  correct.      Tia Alert, MD  Electronically Signed     DSJ/MEDQ  D:  10/23/2008  T:  10/24/2008  Job:  [id number]

## 2011-04-23 NOTE — Op Note (Signed)
NAMEMARQUIE, ADERHOLD                  ACCOUNT NO.:  [id number]   MEDICAL RECORD NO.:  [id number]          PATIENT TYPE:  INP   LOCATION:  3536                         FACILITY:  [facility]   PHYSICIAN:  Tia Alert, MD     DATE OF BIRTH:  1936-02-07   DATE OF PROCEDURE:  DATE OF DISCHARGE:                               OPERATIVE REPORT   PREOPERATIVE DIAGNOSES:  1. Cervical spondylosis with neural foraminal stenosis at C3-C4, C4-      C5, and C5-C6 on the left with a left shoulder and arm pain.  2. Status post anterior cervical diskectomy and fusion with plating at      C6-C7.   POSTOPERATIVE DIAGNOSES:  1. Cervical spondylosis with neural foraminal stenosis at C3-C4, C4-      C5, and C5-C6 on the left with a left shoulder and arm pain.  2. Status post anterior cervical diskectomy and fusion with plating at      C6-C7.   PROCEDURES:  1. Cervical re-exploration with exploration of fusion, C6-C7.  2. Removal of anterior cervical plating, C6-C7.  3. Decompressive anterior cervical diskectomy for central canal nerve      root decompression at C3-C4, C4-C5, and C5-C6.  4. Anterior cervical arthrodesis, C3-C4, C4-C5, C5-C6 utilizing      corticocancellous allografts.  5. Anterior cervical plating, C3-C6 inclusive utilizing the Atlantis      Venture plate.   SURGEON:  Tia Alert, MD   ASSISTANT:  Dr. Mardelle Matte __________   ANESTHESIA:  General endotracheal.   COMPLICATIONS:  None apparent.   INDICATIONS FOR PROCEDURE:  Mr. Dollar is a 75 year old gentleman, who is  referred with severe neck and left shoulder pain with numbness and  tingling down the arm.  He had an MRI and then a CT myelogram, which  showed what looked like to be a solid fusion at C6-C7.  He had severe  neural foraminal stenosis at C3-C4, C4-C5, C5-C6 on the left with  underfilling of the nerve roots on that side.  I recommended a 3-level  anterior cervical diskectomy fusion and plating.  Understood the risks,  benefits, and expected outcome and wished to proceed.   DESCRIPTION OF PROCEDURE:  The patient was taken to the operating room.  After induction of adequate general endotracheal anesthesia, he was  placed in the supine position on the operating room table.  His right  anterior cervical region was prepped with DuraPrep and draped in the  usual sterile fashion.  A 5 mL of local anesthesia was then injected.  A  transverse incision was made to the right of midline, and carried down  to the platysma, which was elevated, opened, and undermined with  Metzenbaum scissors.  I then dissected in a plane medial to the  sternocleidomastoid muscle and internal carotid artery, and lateral to  the trachea and esophagus to expose C3-C4, C4-C5, C5-C6, and C6-C7.  The  anterior cervical plate was identified at C6-C7.  We were able to  dissect through the soft tissues and then remove the screws from the  plate and then  remove the plate, and explored the fusion, and he seemed  to have a good solid fusion at C6-C7.  I then turned my attention to the  anterior cervical diskectomy, the longus colli muscles were taken down  bilaterally from C3-C6 and the Shadow-Line retractors were placed under  this to expose C3-C6.  The annulus was incised at each level and  anterior osteophytes were removed with Leksell rongeur.  The initial  diskectomy was done with pituitary rongeurs and curved curettes.  We  then used the high-speed drill to drill the endplates to prepare for  later arthrodesis.  We drilled down to the level of the posterior  longitudinal ligament at each level, then brought in operating  microscope.  We started at C3-C4.  We drilled further down through the  osteophytes to the level of the post longitudinal ligament, we then  opened this with a nerve hook and removed it, while undercutting the  bodies of C3-C4 until the dura was no longer pushed away from [place], but  was full and capacious all the way  across.  We identified the pedicles  bilaterally.  We marched along the pedicle to identify the C4 nerve  roots and decompress the nerve roots, especially on the left side  because of his left-sided pain.  We then palpated with a nerve hook in a  circumferential fashion, but especially in the foramina at C3-C4 on the  left, we felt like that the nerve hook passed easily.  Therefore, we  lined this with Gelfoam and went to C4-C5.  We performed the exact same  decompression by drilling down to the level of the posterior  longitudinal ligament, opening with a nerve hook and then removing it in  a circumferential fashion, while undercutting the bodies of C4-C5 until  the dura was again full all the way across.  We did identify the C5  nerve roots bilaterally and marched along them; we decompressed them  distally into the foramina.  We then palpated with a nerve hook to  assure adequate decompression of the nerve root in the central canal.  We then lined this with Gelfoam and went to C5-C6 and did the exact same  decompression, opening the posterior longitudinal ligament, then  removing the posterior osteophytes by undercutting bodies of C5-C6 and  marched along the superior endplate of C6, identified the pedicles  bilaterally, marched along the pedicles to identify the C6 nerve root,  especially on the left side because of his left-sided pain.  We then  palpated with a nerve hook to assure adequate decompression and then  went back and removed the Gelfoam from another 2 levels, inspected these  once again, and to inspection and palpation, we felt like we had a good  decompression of all 3 levels.  Therefore, we measured the 3 levels.  We  measured C3-C4 to be 6 mm, C4-C5 to be 7 mm, and C5-C6 to be 8 mm.  We  used corticocancellous allografts and tapped these into position at each  level.  We then used a 60-mm Atlantis Venture plate and placed two 13-mm  variable angle screws in the body of  C3, C4, C5, and C6, and these were  locked in the plate by a locking mechanism within the plate.  We then  spent considerable time drying the surgical bed with bipolar cautery,  Surgifoam and Gelfoam, and irrigation.  The wound was copiously  irrigated, and once we achieved meticulous hemostasis, I placed a 7-flat  JP drain through a separate stab incision and then closed the platysma  with 3-0 Vicryl, closed the subcuticular suture with 3-0 Vicryl, and  closed the skin with Benzoin and Steri-Strips.  The drapes were removed.  A sterile dressing was applied.  The patient was awakened from general  anesthesia, and transferred to the recovery room in stable condition.  At the end of the procedure, all sponge, needle, and instrument counts  were correct.      Tia Alert, MD  Electronically Signed     DSJ/MEDQ  D:  04/22/2008  T:  04/23/2008  Job:  [id number]

## 2011-04-26 NOTE — Op Note (Signed)
Carlos Navarro, Carlos Navarro                  ACCOUNT NO.:  [id number]   MEDICAL RECORD NO.:  [id number]          PATIENT TYPE:  AMB   LOCATION:  SDS                          FACILITY:  [facility]   PHYSICIAN:  Artist Pais. Weingold, M.D.DATE OF BIRTH:  1936/11/28   DATE OF PROCEDURE:  11/22/2005  DATE OF DISCHARGE:  11/22/2005                                 OPERATIVE REPORT   PREOPERATIVE DIAGNOSIS:  Left thumb carpometacarpal arthritis.   POSTOPERATIVE DIAGNOSIS:  Left thumb carpometacarpal arthritis.   PROCEDURE:  Left thumb CMC arthroplasty with left large Artelon spacer.   SURGEON:  Artist Pais. Mina Marble, M.D.   ASSISTANT:  None.   ANESTHESIA:  General anesthesia.   TOURNIQUET TIME:  One hour and one minute.   COMPLICATIONS:  None.   DRAINS:  None.   DESCRIPTION OF PROCEDURE:  Patient was taken to the operating room after the  induction of general anesthesia.  The left upper extremity is prepped and  draped in the usual sterile fashion.  An Esmarch was used to exsanguinate  the limb.  Tourniquet was then inflated 250 mmHg.  At this point in time, a  longitudinal incision was made over the [facility] joint dorsally, left thumb.  Skin was incised.  The extensor tendons were retracted both to the radial  and ulnar sides after the midline split of the extensor sheath.  The deep  branch of the radial artery was identified and retracted and the [facility]  capsulotomy was performed and a large proximally based flap was elevated.  At this point in time, the [facility] joint was approached using a small sagittal  saw at the distal aspect of the trapezium.  Articular surface was excised  and the [facility] joint was debrided using the synovectomy rongeur to remove loose  bodies and osteophytes. At this point in time, the dorsal aspect of the  metacarpal base and the trapezium were decorticated and the large Artelon  spacer was then placed into the [facility] joint using 18 mm x 2 mm modular handset  screws, one in the trapezium  and one in the metacarpal.  The wound was then  thoroughly irrigated.  The capsule overlying the [facility] joint was repaired with  4-0 Mersilene and the skin with 3-0 Prolene subcuticular stitch.  Intraoperative fluoroscopy revealed good reduction of the joint, good  placement of the spacer and good placement of the hardware.  The patient was  then placed in sterile dressing of Steri-Strips, 4x4s, fluffs and a radial  gutter splint.  The patient tolerated the procedure well and went to the  recovery room in stable condition.      Artist Pais Mina Marble, M.D.  Electronically Signed     MAW/MEDQ  D:  11/22/2005  T:  11/25/2005  Job:  [id number]

## 2011-04-26 NOTE — Op Note (Signed)
NAMEJAICOB, DIA                  ACCOUNT NO.:  [id number]   MEDICAL RECORD NO.:  [id number]          PATIENT TYPE:  INP   LOCATION:  2858                         FACILITY:  [facility]   PHYSICIAN:  Tia Alert, MD     DATE OF BIRTH:  10/15/1936   DATE OF PROCEDURE:  05/29/2006  DATE OF DISCHARGE:                                 OPERATIVE REPORT   PREOPERATIVE DIAGNOSIS:  Cervical disk herniation C6-C7 with right C7  radiculopathy.   POSTOPERATIVE DIAGNOSIS:  Cervical disk herniation C6-C7 with right C7  radiculopathy.   PROCEDURE:  1.  Decompressive anterior cervical diskectomy C6-C7.  2.  Anterior cervical arthrodesis C6-C7 utilizing a 7 mm Capstone PEEK      interbody cage packed with local autograft.  3.  Anterior cervical plating C6-C7 utilizing a 25 mm Venture plate.   SURGEON:  Tia Alert, M.D.   ASSISTANT:  Reinaldo Meeker, M.D.   ANESTHESIA:  General endotracheal anesthesia.   COMPLICATIONS:  None apparent.   INDICATIONS FOR PROCEDURE:  Carlos Navarro is a 75 year old white male who is  referred with severe neck pain with right arm pain in the C7 distribution.  He had progressive numbness and weakness in the right arm.  He had an MRI  which showed a herniated disk at C6-C7 on the right side.  I recommended a  ACDF with plating at C6-C7.  He understood the risks, benefits and expected  outcome and wished to proceed.   DESCRIPTION OF PROCEDURE:  The patient was taken to the operating room and  after induction of adequate generalized endotracheal anesthesia, he was  placed in the supine position on the operating room table.  His right  anterior cervical region was prepped with DuraPrep and draped in the usual  sterile fashion.  5 mL local anesthesia was injected and an incision was  made to the right of midline and carried down to the platysma which was  elevated, opened and undermined with Metzenbaum scissors.  I then dissected  in a plane medial to the  sternocleidomastoid muscle and internal carotid  artery and lateral to the trachea and esophagus to expose C6-C7.  Interoperative fluoroscopy confirmed my level and then the longus colli  muscles were taken down and the Shadowline retractors were placed under  these to expose C6-C7. The annulus was incised and the initial diskectomy  was done with pituitary rongeurs and curved curettes.  I then used the high  speed drill to drill the endplates down to the level of the posterior  longitudinal ligament.  The drill shavings were saved in a mucous trap for  later arthrodesis.  I brought in the operating microscope and opened up the  posterior longitudinal ligament with a nerve hook and then removed it in a  circumferential fashion while undercutting the bodies of C6 and C7.  Bilateral foraminotomies were performed.  Several small free fragments were  removed from the foramen at C6-C7 on the right confirming our disk  herniation on the right at C6-C7. The C7 nerve root was identified and  followed out past the pedicle level distally into the foramen until a  significant portion of the nerve was visualized and decompressed.  We then  used a nerve hook to palpate into the foramen to assure adequate  decompression and there was no compression of the C7 nerve roots  bilaterally.  The central canal was well decompressed undercutting the  vertebral bodies.  We then measured the interspace to be 7 mm.  I used a 7  mm PEEK interbody cage packed with local autograft and tapped this into  position at C6-C7 and then used a 25 mm Avenger plate and placed two 13 mm  variable angle screws in the bodies C6 and C7.  These locked into position  on the plate with the locking mechanism.  We then irrigated with saline  solution containing bacitracin, dried all bleeding points with bipolar  cautery, and once meticulous hemostasis was achieved, closed the platysma  with 3-0 Vicryl, closed the subcuticular tissues  with 3-0 Vicryl, and closed  the skin with Benzoin and Steri-Strips.  The drapes removed, a sterile  dressing was applied.  The patient was awakened from general anesthesia and  transferred to the recovery room in stable condition.  At the end of the  procedure, all sponge, needle and instrument counts were correct.      Tia Alert, MD  Electronically Signed     DSJ/MEDQ  D:  05/29/2006  T:  05/29/2006  Job:  [id number]

## 2011-07-08 ENCOUNTER — Other Ambulatory Visit: Payer: Self-pay | Admitting: Gastroenterology

## 2011-09-09 LAB — BASIC METABOLIC PANEL
CO2: 30
Calcium: 9.3
Creatinine, Ser: 0.98
GFR calc Af Amer: >60
GFR calc non Af Amer: >60

## 2011-09-09 LAB — CBC
MCHC: 33.9
RBC: 4.28
WBC: 6.4

## 2011-09-09 LAB — POCT I-STAT 4, (NA,K, GLUC, HGB,HCT)
Glucose, Bld: 146 — ABNORMAL HIGH
HCT: 37 — ABNORMAL LOW

## 2011-09-09 LAB — DIFFERENTIAL
Basophils Relative: 0
Monocytes Relative: 8
Neutro Abs: 4
Neutrophils Relative %: 62

## 2011-09-09 LAB — TYPE AND SCREEN

## 2011-09-09 LAB — PROTIME-INR: INR: 1

## 2011-09-09 LAB — APTT: aPTT: 27

## 2011-09-10 LAB — CULTURE, ROUTINE-ABSCESS

## 2011-09-10 LAB — BASIC METABOLIC PANEL
BUN: 13
BUN: 14
BUN: 8
BUN: 8
BUN: 9
CO2: 25
CO2: 26
CO2: 26
CO2: 27
CO2: 27
CO2: 27
CO2: 29
Calcium: 8.2 — ABNORMAL LOW
Calcium: 8.3 — ABNORMAL LOW
Calcium: 8.3 — ABNORMAL LOW
Calcium: 8.4
Calcium: 8.6
Chloride: 100
Chloride: 101
Chloride: 103
Chloride: 104
Chloride: 104
Creatinine, Ser: 0.89
Creatinine, Ser: 1.05
GFR calc Af Amer: >60
GFR calc Af Amer: >60
GFR calc Af Amer: >60
GFR calc non Af Amer: >60
GFR calc non Af Amer: >60
GFR calc non Af Amer: >60
Glucose, Bld: 101 — ABNORMAL HIGH
Glucose, Bld: 112 — ABNORMAL HIGH
Glucose, Bld: 88
Glucose, Bld: 90
Potassium: 4
Potassium: 4
Potassium: 4.2
Potassium: 4.2
Potassium: 4.2
Sodium: 132 — ABNORMAL LOW
Sodium: 133 — ABNORMAL LOW
Sodium: 133 — ABNORMAL LOW
Sodium: 133 — ABNORMAL LOW
Sodium: 134 — ABNORMAL LOW
Sodium: 136

## 2011-09-10 LAB — CBC
Hemoglobin: 11.4 — ABNORMAL LOW
Hemoglobin: 12.4 — ABNORMAL LOW
MCHC: 33.6
MCHC: 34.8
MCHC: 35.1
MCV: 94.2
MCV: 96.2
Platelets: 174
Platelets: 227
Platelets: 274
RDW: 13
RDW: 13.6
RDW: 13.9
WBC: 6.8

## 2011-09-10 LAB — DIFFERENTIAL
Basophils Absolute: 0.1
Basophils Relative: 0
Basophils Relative: 0
Eosinophils Absolute: 0
Eosinophils Absolute: 0
Eosinophils Absolute: 0.2
Eosinophils Relative: 0
Eosinophils Relative: 3
Lymphs Abs: 1.3
Monocytes Absolute: 0.9
Monocytes Relative: 8
Neutrophils Relative %: 70
Neutrophils Relative %: 85 — ABNORMAL HIGH
Neutrophils Relative %: 87 — ABNORMAL HIGH

## 2011-09-10 LAB — CSF CELL COUNT WITH DIFFERENTIAL
Eosinophils, CSF: 0
Lymphs, CSF: 13 — ABNORMAL LOW
Other Cells, CSF: 0
Segmented Neutrophils-CSF: 49 — ABNORMAL HIGH
Segmented Neutrophils-CSF: 75 — ABNORMAL HIGH
Segmented Neutrophils-CSF: 78 — ABNORMAL HIGH

## 2011-09-10 LAB — PROTEIN AND GLUCOSE, CSF
Glucose, CSF: 34 — ABNORMAL LOW
Glucose, CSF: 42 — ABNORMAL LOW
Total  Protein, CSF: 51 — ABNORMAL HIGH
Total  Protein, CSF: 66 — ABNORMAL HIGH

## 2011-09-10 LAB — ANAEROBIC CULTURE

## 2011-09-10 LAB — VANCOMYCIN, TROUGH: Vancomycin Tr: 9.1 — ABNORMAL LOW

## 2011-09-10 LAB — GRAM STAIN

## 2011-09-10 LAB — CSF CULTURE W GRAM STAIN: Culture: NO GROWTH

## 2011-09-10 LAB — WOUND CULTURE

## 2011-09-10 LAB — POCT I-STAT 4, (NA,K, GLUC, HGB,HCT)
Hemoglobin: 12.6 — ABNORMAL LOW
Potassium: 4.1
Sodium: 137

## 2011-09-10 LAB — CK TOTAL AND CKMB (NOT AT ARMC)
Relative Index: INVALID
Total CK: 96

## 2011-12-23 DIAGNOSIS — Z1331 Encounter for screening for depression: Secondary | ICD-10-CM | POA: Diagnosis not present

## 2011-12-23 DIAGNOSIS — I2510 Atherosclerotic heart disease of native coronary artery without angina pectoris: Secondary | ICD-10-CM | POA: Diagnosis not present

## 2011-12-23 DIAGNOSIS — E785 Hyperlipidemia, unspecified: Secondary | ICD-10-CM | POA: Diagnosis not present

## 2012-07-10 DIAGNOSIS — R0609 Other forms of dyspnea: Secondary | ICD-10-CM | POA: Diagnosis not present

## 2012-07-10 DIAGNOSIS — R030 Elevated blood-pressure reading, without diagnosis of hypertension: Secondary | ICD-10-CM | POA: Diagnosis not present

## 2012-08-24 DIAGNOSIS — I10 Essential (primary) hypertension: Secondary | ICD-10-CM | POA: Diagnosis not present

## 2012-11-10 DIAGNOSIS — M779 Enthesopathy, unspecified: Secondary | ICD-10-CM | POA: Diagnosis not present

## 2012-11-19 DIAGNOSIS — M2040 Other hammer toe(s) (acquired), unspecified foot: Secondary | ICD-10-CM | POA: Diagnosis not present

## 2012-12-08 DIAGNOSIS — M898X9 Other specified disorders of bone, unspecified site: Secondary | ICD-10-CM | POA: Diagnosis not present

## 2012-12-08 DIAGNOSIS — M79609 Pain in unspecified limb: Secondary | ICD-10-CM | POA: Diagnosis not present

## 2012-12-08 DIAGNOSIS — M2040 Other hammer toe(s) (acquired), unspecified foot: Secondary | ICD-10-CM | POA: Diagnosis not present

## 2012-12-10 DIAGNOSIS — R300 Dysuria: Secondary | ICD-10-CM | POA: Diagnosis not present

## 2012-12-10 DIAGNOSIS — R1032 Left lower quadrant pain: Secondary | ICD-10-CM | POA: Diagnosis not present

## 2012-12-11 ENCOUNTER — Other Ambulatory Visit: Payer: Self-pay | Admitting: Internal Medicine

## 2012-12-11 ENCOUNTER — Ambulatory Visit
Admission: RE | Admit: 2012-12-11 | Discharge: 2012-12-11 | Disposition: A | Discharge status: Home / Self Care | Payer: Medicare Other | Source: Ambulatory Visit | Attending: Internal Medicine | Admitting: Internal Medicine

## 2012-12-11 DIAGNOSIS — R1032 Left lower quadrant pain: Secondary | ICD-10-CM

## 2012-12-11 DIAGNOSIS — K5730 Diverticulosis of large intestine without perforation or abscess without bleeding: Secondary | ICD-10-CM | POA: Diagnosis not present

## 2012-12-11 DIAGNOSIS — K449 Diaphragmatic hernia without obstruction or gangrene: Secondary | ICD-10-CM | POA: Diagnosis not present

## 2012-12-11 MED ORDER — IOHEXOL 300 MG/ML  SOLN
125.0000 mL | Freq: Once | INTRAMUSCULAR | Status: AC | PRN
  Administered 2012-12-11: 125 mL via INTRAVENOUS

## 2012-12-24 DIAGNOSIS — Z1331 Encounter for screening for depression: Secondary | ICD-10-CM | POA: Diagnosis not present

## 2012-12-24 DIAGNOSIS — I10 Essential (primary) hypertension: Secondary | ICD-10-CM | POA: Diagnosis not present

## 2012-12-24 DIAGNOSIS — Z0000 Encounter for general adult medical examination without abnormal findings: Secondary | ICD-10-CM | POA: Diagnosis not present

## 2012-12-24 DIAGNOSIS — N529 Male erectile dysfunction, unspecified: Secondary | ICD-10-CM | POA: Diagnosis not present

## 2012-12-24 DIAGNOSIS — E785 Hyperlipidemia, unspecified: Secondary | ICD-10-CM | POA: Diagnosis not present

## 2013-03-16 DIAGNOSIS — M1710 Unilateral primary osteoarthritis, unspecified knee: Secondary | ICD-10-CM | POA: Diagnosis not present

## 2013-04-12 DIAGNOSIS — M1710 Unilateral primary osteoarthritis, unspecified knee: Secondary | ICD-10-CM | POA: Diagnosis not present

## 2013-05-04 DIAGNOSIS — M1710 Unilateral primary osteoarthritis, unspecified knee: Secondary | ICD-10-CM | POA: Diagnosis not present

## 2013-06-09 DIAGNOSIS — M1710 Unilateral primary osteoarthritis, unspecified knee: Secondary | ICD-10-CM | POA: Diagnosis not present

## 2013-06-10 DIAGNOSIS — M23305 Other meniscus derangements, unspecified medial meniscus, unspecified knee: Secondary | ICD-10-CM | POA: Diagnosis not present

## 2013-06-10 DIAGNOSIS — M6750 Plica syndrome, unspecified knee: Secondary | ICD-10-CM | POA: Diagnosis not present

## 2013-06-10 DIAGNOSIS — S83509A Sprain of unspecified cruciate ligament of unspecified knee, initial encounter: Secondary | ICD-10-CM | POA: Diagnosis not present

## 2013-06-10 DIAGNOSIS — M7120 Synovial cyst of popliteal space [Baker], unspecified knee: Secondary | ICD-10-CM | POA: Diagnosis not present

## 2013-06-10 DIAGNOSIS — IMO0002 Reserved for concepts with insufficient information to code with codable children: Secondary | ICD-10-CM | POA: Diagnosis not present

## 2013-06-10 DIAGNOSIS — M1710 Unilateral primary osteoarthritis, unspecified knee: Secondary | ICD-10-CM | POA: Diagnosis not present

## 2013-06-15 DIAGNOSIS — M1710 Unilateral primary osteoarthritis, unspecified knee: Secondary | ICD-10-CM | POA: Diagnosis not present

## 2013-06-15 DIAGNOSIS — IMO0002 Reserved for concepts with insufficient information to code with codable children: Secondary | ICD-10-CM | POA: Diagnosis not present

## 2013-06-18 DIAGNOSIS — I252 Old myocardial infarction: Secondary | ICD-10-CM | POA: Diagnosis not present

## 2013-06-18 DIAGNOSIS — Z8489 Family history of other specified conditions: Secondary | ICD-10-CM | POA: Diagnosis not present

## 2013-06-18 DIAGNOSIS — I10 Essential (primary) hypertension: Secondary | ICD-10-CM | POA: Diagnosis not present

## 2013-06-18 DIAGNOSIS — Z7982 Long term (current) use of aspirin: Secondary | ICD-10-CM | POA: Diagnosis not present

## 2013-06-18 DIAGNOSIS — M109 Gout, unspecified: Secondary | ICD-10-CM | POA: Diagnosis not present

## 2013-06-18 DIAGNOSIS — Z8249 Family history of ischemic heart disease and other diseases of the circulatory system: Secondary | ICD-10-CM | POA: Diagnosis not present

## 2013-06-18 DIAGNOSIS — Z79899 Other long term (current) drug therapy: Secondary | ICD-10-CM | POA: Diagnosis not present

## 2013-06-18 DIAGNOSIS — Z833 Family history of diabetes mellitus: Secondary | ICD-10-CM | POA: Diagnosis not present

## 2013-06-18 DIAGNOSIS — M2340 Loose body in knee, unspecified knee: Secondary | ICD-10-CM | POA: Diagnosis not present

## 2013-06-18 DIAGNOSIS — M1710 Unilateral primary osteoarthritis, unspecified knee: Secondary | ICD-10-CM | POA: Diagnosis not present

## 2013-06-18 DIAGNOSIS — Z885 Allergy status to narcotic agent status: Secondary | ICD-10-CM | POA: Diagnosis not present

## 2013-06-18 DIAGNOSIS — M6750 Plica syndrome, unspecified knee: Secondary | ICD-10-CM | POA: Diagnosis not present

## 2013-06-18 DIAGNOSIS — IMO0002 Reserved for concepts with insufficient information to code with codable children: Secondary | ICD-10-CM | POA: Diagnosis not present

## 2013-06-25 DIAGNOSIS — R1312 Dysphagia, oropharyngeal phase: Secondary | ICD-10-CM | POA: Diagnosis not present

## 2013-06-25 DIAGNOSIS — I10 Essential (primary) hypertension: Secondary | ICD-10-CM | POA: Diagnosis not present

## 2013-06-25 DIAGNOSIS — N529 Male erectile dysfunction, unspecified: Secondary | ICD-10-CM | POA: Diagnosis not present

## 2013-06-25 DIAGNOSIS — N400 Enlarged prostate without lower urinary tract symptoms: Secondary | ICD-10-CM | POA: Diagnosis not present

## 2013-06-29 DIAGNOSIS — H9319 Tinnitus, unspecified ear: Secondary | ICD-10-CM | POA: Diagnosis not present

## 2013-06-29 DIAGNOSIS — H6120 Impacted cerumen, unspecified ear: Secondary | ICD-10-CM | POA: Diagnosis not present

## 2013-06-29 DIAGNOSIS — J309 Allergic rhinitis, unspecified: Secondary | ICD-10-CM | POA: Diagnosis not present

## 2013-06-29 DIAGNOSIS — R1313 Dysphagia, pharyngeal phase: Secondary | ICD-10-CM | POA: Diagnosis not present

## 2013-07-19 DIAGNOSIS — M6750 Plica syndrome, unspecified knee: Secondary | ICD-10-CM | POA: Diagnosis not present

## 2013-07-19 DIAGNOSIS — IMO0002 Reserved for concepts with insufficient information to code with codable children: Secondary | ICD-10-CM | POA: Diagnosis not present

## 2013-07-19 DIAGNOSIS — M1710 Unilateral primary osteoarthritis, unspecified knee: Secondary | ICD-10-CM | POA: Diagnosis not present

## 2013-07-20 DIAGNOSIS — H9319 Tinnitus, unspecified ear: Secondary | ICD-10-CM | POA: Diagnosis not present

## 2013-07-20 DIAGNOSIS — H905 Unspecified sensorineural hearing loss: Secondary | ICD-10-CM | POA: Diagnosis not present

## 2013-07-26 DIAGNOSIS — M1710 Unilateral primary osteoarthritis, unspecified knee: Secondary | ICD-10-CM | POA: Diagnosis not present

## 2013-08-02 DIAGNOSIS — N400 Enlarged prostate without lower urinary tract symptoms: Secondary | ICD-10-CM | POA: Diagnosis not present

## 2013-08-02 DIAGNOSIS — M1710 Unilateral primary osteoarthritis, unspecified knee: Secondary | ICD-10-CM | POA: Diagnosis not present

## 2013-08-03 ENCOUNTER — Other Ambulatory Visit: Payer: Self-pay | Admitting: Otolaryngology

## 2013-08-03 DIAGNOSIS — H905 Unspecified sensorineural hearing loss: Secondary | ICD-10-CM

## 2013-08-07 ENCOUNTER — Other Ambulatory Visit: Payer: Medicare Other

## 2013-08-10 DIAGNOSIS — M1710 Unilateral primary osteoarthritis, unspecified knee: Secondary | ICD-10-CM | POA: Diagnosis not present

## 2013-08-11 ENCOUNTER — Ambulatory Visit
Admission: RE | Admit: 2013-08-11 | Discharge: 2013-08-11 | Disposition: A | Discharge status: Home / Self Care | Payer: Medicare Other | Source: Ambulatory Visit | Attending: Otolaryngology | Admitting: Otolaryngology

## 2013-08-11 DIAGNOSIS — H905 Unspecified sensorineural hearing loss: Secondary | ICD-10-CM | POA: Diagnosis not present

## 2013-08-11 MED ORDER — GADOBENATE DIMEGLUMINE 529 MG/ML IV SOLN
19.0000 mL | Freq: Once | INTRAVENOUS | Status: AC | PRN
  Administered 2013-08-11: 19 mL via INTRAVENOUS

## 2013-08-31 DIAGNOSIS — J309 Allergic rhinitis, unspecified: Secondary | ICD-10-CM | POA: Diagnosis not present

## 2013-08-31 DIAGNOSIS — R1313 Dysphagia, pharyngeal phase: Secondary | ICD-10-CM | POA: Diagnosis not present

## 2013-08-31 DIAGNOSIS — H905 Unspecified sensorineural hearing loss: Secondary | ICD-10-CM | POA: Diagnosis not present

## 2013-09-13 DIAGNOSIS — M1710 Unilateral primary osteoarthritis, unspecified knee: Secondary | ICD-10-CM | POA: Diagnosis not present

## 2013-11-02 DIAGNOSIS — M1710 Unilateral primary osteoarthritis, unspecified knee: Secondary | ICD-10-CM | POA: Diagnosis not present

## 2013-11-09 DIAGNOSIS — M1710 Unilateral primary osteoarthritis, unspecified knee: Secondary | ICD-10-CM | POA: Diagnosis not present

## 2013-11-16 DIAGNOSIS — M1710 Unilateral primary osteoarthritis, unspecified knee: Secondary | ICD-10-CM | POA: Diagnosis not present

## 2013-12-06 DIAGNOSIS — M1710 Unilateral primary osteoarthritis, unspecified knee: Secondary | ICD-10-CM | POA: Diagnosis not present

## 2014-01-03 DIAGNOSIS — I10 Essential (primary) hypertension: Secondary | ICD-10-CM | POA: Diagnosis not present

## 2014-01-03 DIAGNOSIS — N400 Enlarged prostate without lower urinary tract symptoms: Secondary | ICD-10-CM | POA: Diagnosis not present

## 2014-01-03 DIAGNOSIS — M1710 Unilateral primary osteoarthritis, unspecified knee: Secondary | ICD-10-CM | POA: Diagnosis not present

## 2014-01-03 DIAGNOSIS — Z1331 Encounter for screening for depression: Secondary | ICD-10-CM | POA: Diagnosis not present

## 2014-01-03 DIAGNOSIS — IMO0002 Reserved for concepts with insufficient information to code with codable children: Secondary | ICD-10-CM | POA: Diagnosis not present

## 2014-01-03 DIAGNOSIS — E785 Hyperlipidemia, unspecified: Secondary | ICD-10-CM | POA: Diagnosis not present

## 2014-07-04 DIAGNOSIS — E785 Hyperlipidemia, unspecified: Secondary | ICD-10-CM | POA: Diagnosis not present

## 2014-07-04 DIAGNOSIS — N529 Male erectile dysfunction, unspecified: Secondary | ICD-10-CM | POA: Diagnosis not present

## 2014-07-04 DIAGNOSIS — R413 Other amnesia: Secondary | ICD-10-CM | POA: Diagnosis not present

## 2014-07-04 DIAGNOSIS — Z23 Encounter for immunization: Secondary | ICD-10-CM | POA: Diagnosis not present

## 2014-07-04 DIAGNOSIS — I10 Essential (primary) hypertension: Secondary | ICD-10-CM | POA: Diagnosis not present

## 2014-07-05 DIAGNOSIS — E538 Deficiency of other specified B group vitamins: Secondary | ICD-10-CM | POA: Diagnosis not present

## 2014-07-05 DIAGNOSIS — D510 Vitamin B12 deficiency anemia due to intrinsic factor deficiency: Secondary | ICD-10-CM | POA: Diagnosis not present

## 2014-07-06 DIAGNOSIS — D510 Vitamin B12 deficiency anemia due to intrinsic factor deficiency: Secondary | ICD-10-CM | POA: Diagnosis not present

## 2014-07-07 DIAGNOSIS — D510 Vitamin B12 deficiency anemia due to intrinsic factor deficiency: Secondary | ICD-10-CM | POA: Diagnosis not present

## 2014-07-08 DIAGNOSIS — D510 Vitamin B12 deficiency anemia due to intrinsic factor deficiency: Secondary | ICD-10-CM | POA: Diagnosis not present

## 2014-07-11 DIAGNOSIS — D510 Vitamin B12 deficiency anemia due to intrinsic factor deficiency: Secondary | ICD-10-CM | POA: Diagnosis not present

## 2014-07-18 DIAGNOSIS — D510 Vitamin B12 deficiency anemia due to intrinsic factor deficiency: Secondary | ICD-10-CM | POA: Diagnosis not present

## 2014-07-20 ENCOUNTER — Encounter: Payer: Self-pay | Admitting: *Deleted

## 2014-07-25 DIAGNOSIS — D510 Vitamin B12 deficiency anemia due to intrinsic factor deficiency: Secondary | ICD-10-CM | POA: Diagnosis not present

## 2014-08-01 DIAGNOSIS — D510 Vitamin B12 deficiency anemia due to intrinsic factor deficiency: Secondary | ICD-10-CM | POA: Diagnosis not present

## 2014-08-08 DIAGNOSIS — D510 Vitamin B12 deficiency anemia due to intrinsic factor deficiency: Secondary | ICD-10-CM | POA: Diagnosis not present

## 2014-08-09 DIAGNOSIS — H2510 Age-related nuclear cataract, unspecified eye: Secondary | ICD-10-CM | POA: Diagnosis not present

## 2014-08-09 DIAGNOSIS — H40039 Anatomical narrow angle, unspecified eye: Secondary | ICD-10-CM | POA: Diagnosis not present

## 2014-09-12 DIAGNOSIS — D510 Vitamin B12 deficiency anemia due to intrinsic factor deficiency: Secondary | ICD-10-CM | POA: Diagnosis not present

## 2014-10-17 DIAGNOSIS — D510 Vitamin B12 deficiency anemia due to intrinsic factor deficiency: Secondary | ICD-10-CM | POA: Diagnosis not present

## 2014-11-21 DIAGNOSIS — D510 Vitamin B12 deficiency anemia due to intrinsic factor deficiency: Secondary | ICD-10-CM | POA: Diagnosis not present

## 2015-01-03 DIAGNOSIS — D510 Vitamin B12 deficiency anemia due to intrinsic factor deficiency: Secondary | ICD-10-CM | POA: Diagnosis not present

## 2015-01-03 DIAGNOSIS — I252 Old myocardial infarction: Secondary | ICD-10-CM | POA: Diagnosis not present

## 2015-01-03 DIAGNOSIS — Z0000 Encounter for general adult medical examination without abnormal findings: Secondary | ICD-10-CM | POA: Diagnosis not present

## 2015-01-03 DIAGNOSIS — H8110 Benign paroxysmal vertigo, unspecified ear: Secondary | ICD-10-CM | POA: Diagnosis not present

## 2015-01-03 DIAGNOSIS — I10 Essential (primary) hypertension: Secondary | ICD-10-CM | POA: Diagnosis not present

## 2015-01-03 DIAGNOSIS — D126 Benign neoplasm of colon, unspecified: Secondary | ICD-10-CM | POA: Diagnosis not present

## 2015-01-03 DIAGNOSIS — Z1389 Encounter for screening for other disorder: Secondary | ICD-10-CM | POA: Diagnosis not present

## 2015-01-03 DIAGNOSIS — E780 Pure hypercholesterolemia: Secondary | ICD-10-CM | POA: Diagnosis not present

## 2015-01-30 DIAGNOSIS — I10 Essential (primary) hypertension: Secondary | ICD-10-CM | POA: Diagnosis not present

## 2015-02-01 ENCOUNTER — Other Ambulatory Visit: Payer: Self-pay | Admitting: Gastroenterology

## 2015-02-07 ENCOUNTER — Other Ambulatory Visit: Payer: Self-pay | Admitting: Gastroenterology

## 2015-02-09 ENCOUNTER — Encounter (HOSPITAL_COMMUNITY): Payer: Self-pay | Admitting: *Deleted

## 2015-02-20 NOTE — Anesthesia Preprocedure Evaluation (Addendum)
Anesthesia Evaluation  Patient identified by MRN, date of birth, ID band Patient awake    Reviewed: Allergy & Precautions, NPO status   History of Anesthesia Complications Negative for: history of anesthetic complications  Airway Mallampati: I   Neck ROM: Full    Dental  (+) Teeth Intact   Pulmonary neg pulmonary ROS,  breath sounds clear to auscultation        Cardiovascular hypertension, + CAD Rhythm:Regular Rate:Normal     Neuro/Psych negative neurological ROS     GI/Hepatic Neg liver ROS,   Endo/Other  negative endocrine ROS  Renal/GU negative Renal ROS     Musculoskeletal  (+) Arthritis -,   Abdominal   Peds  Hematology negative hematology ROS (+)   Anesthesia Other Findings   Reproductive/Obstetrics                            Anesthesia Physical Anesthesia Plan  ASA: III  Anesthesia Plan: MAC   Post-op Pain Management:    Induction: Intravenous  Airway Management Planned: Natural Airway  Additional Equipment:   Intra-op Plan:   Post-operative Plan:   Informed Consent: I have reviewed the patients History and Physical, chart, labs and discussed the procedure including the risks, benefits and alternatives for the proposed anesthesia with the patient or authorized representative who has indicated his/her understanding and acceptance.     Plan Discussed with: CRNA and Surgeon  Anesthesia Plan Comments:         Anesthesia Quick Evaluation

## 2015-02-21 ENCOUNTER — Ambulatory Visit (HOSPITAL_COMMUNITY): Payer: Medicare Other | Admitting: Anesthesiology

## 2015-02-21 ENCOUNTER — Ambulatory Visit (HOSPITAL_COMMUNITY)
Admission: RE | Admit: 2015-02-21 | Discharge: 2015-02-21 | Disposition: A | Discharge status: Home / Self Care | Payer: Medicare Other | Source: Ambulatory Visit | Attending: Gastroenterology | Admitting: Gastroenterology

## 2015-02-21 ENCOUNTER — Encounter (HOSPITAL_COMMUNITY)
Admission: RE | Disposition: A | Discharge status: Home / Self Care | Payer: Self-pay | Source: Ambulatory Visit | Attending: Gastroenterology

## 2015-02-21 DIAGNOSIS — N400 Enlarged prostate without lower urinary tract symptoms: Secondary | ICD-10-CM | POA: Insufficient documentation

## 2015-02-21 DIAGNOSIS — M1990 Unspecified osteoarthritis, unspecified site: Secondary | ICD-10-CM | POA: Insufficient documentation

## 2015-02-21 DIAGNOSIS — M5416 Radiculopathy, lumbar region: Secondary | ICD-10-CM | POA: Insufficient documentation

## 2015-02-21 DIAGNOSIS — K5730 Diverticulosis of large intestine without perforation or abscess without bleeding: Secondary | ICD-10-CM | POA: Diagnosis not present

## 2015-02-21 DIAGNOSIS — Z09 Encounter for follow-up examination after completed treatment for conditions other than malignant neoplasm: Secondary | ICD-10-CM | POA: Diagnosis present

## 2015-02-21 DIAGNOSIS — I10 Essential (primary) hypertension: Secondary | ICD-10-CM | POA: Insufficient documentation

## 2015-02-21 DIAGNOSIS — N529 Male erectile dysfunction, unspecified: Secondary | ICD-10-CM | POA: Diagnosis not present

## 2015-02-21 DIAGNOSIS — Z800 Family history of malignant neoplasm of digestive organs: Secondary | ICD-10-CM | POA: Diagnosis not present

## 2015-02-21 DIAGNOSIS — I2510 Atherosclerotic heart disease of native coronary artery without angina pectoris: Secondary | ICD-10-CM | POA: Diagnosis not present

## 2015-02-21 DIAGNOSIS — Z9049 Acquired absence of other specified parts of digestive tract: Secondary | ICD-10-CM | POA: Diagnosis not present

## 2015-02-21 DIAGNOSIS — Z87442 Personal history of urinary calculi: Secondary | ICD-10-CM | POA: Insufficient documentation

## 2015-02-21 DIAGNOSIS — Z1211 Encounter for screening for malignant neoplasm of colon: Secondary | ICD-10-CM | POA: Diagnosis not present

## 2015-02-21 DIAGNOSIS — Z79899 Other long term (current) drug therapy: Secondary | ICD-10-CM | POA: Insufficient documentation

## 2015-02-21 DIAGNOSIS — Z86010 Personal history of colonic polyps: Secondary | ICD-10-CM | POA: Diagnosis not present

## 2015-02-21 HISTORY — PX: COLONOSCOPY WITH PROPOFOL: SHX5780

## 2015-02-21 SURGERY — COLONOSCOPY WITH PROPOFOL
Anesthesia: Monitor Anesthesia Care

## 2015-02-21 MED ORDER — SODIUM CHLORIDE 0.9 % IV SOLN: INTRAVENOUS | Status: DC

## 2015-02-21 MED ORDER — LACTATED RINGERS IV SOLN
INTRAVENOUS | Status: DC | PRN
  Administered 2015-02-21: 08:00:00 via INTRAVENOUS

## 2015-02-21 MED ORDER — LACTATED RINGERS IV SOLN: INTRAVENOUS | Status: DC

## 2015-02-21 MED ORDER — PROPOFOL 10 MG/ML IV BOLUS
INTRAVENOUS | Status: DC | PRN
  Administered 2015-02-21 (×2): 50 mg via INTRAVENOUS
  Administered 2015-02-21: 25 mg via INTRAVENOUS
  Administered 2015-02-21: 50 mg via INTRAVENOUS
  Administered 2015-02-21: 25 mg via INTRAVENOUS

## 2015-02-21 MED ORDER — PROPOFOL 10 MG/ML IV BOLUS
INTRAVENOUS | Status: AC
  Filled 2015-02-21: qty 20

## 2015-02-21 SURGICAL SUPPLY — 22 items

## 2015-02-21 NOTE — Op Note (Signed)
Procedure: Surveillance colonoscopy. 07/08/2011 colonoscopy with removal of multiple adenomatous colon polyps  Endoscopist: Earle Gell  Premedication: Propofol administered by anesthesia  Procedure: The patient was placed in the left lateral decubitus position. Anal inspection and digital rectal exam were normal. The Pentax pediatric colonoscope was introduced into the rectum and advanced to the cecum. A normal-appearing appendiceal orifice was identified. A normal-appearing ileocecal valve was intubated and the terminal ileum inspected. Colonic preparation exam today was good. Withdrawal time was 13 minutes  Rectum. Normal. Retroflexed view of the distal rectum normal  Sigmoid colon and descending colon. Left colonic diverticulosis  Splenic flexure. Normal  Transverse colon. Normal  Hepatic flexure. Normal  Ascending colon. Normal  Cecum and ileocecal valve. Normal  Terminal ileum. Normal  Assessment: Normal surveillance colonoscopy

## 2015-02-21 NOTE — Transfer of Care (Signed)
Immediate Anesthesia Transfer of Care Note  Patient: Carlos Navarro  Procedure(s) Performed: Procedure(s): COLONOSCOPY WITH PROPOFOL (N/A)  Patient Location: PACU  Anesthesia Type:MAC  Level of Consciousness: awake, sedated and patient cooperative  Airway & Oxygen Therapy: Patient Spontanous Breathing and Patient connected to face mask oxygen  Post-op Assessment: Report given to RN and Post -op Vital signs reviewed and stable  Post vital signs: Reviewed and stable  Last Vitals:  Filed Vitals:   02/21/15 0742  BP: 169/97  Pulse: 71  Resp: 14    Complications: No apparent anesthesia complications

## 2015-02-21 NOTE — Discharge Instructions (Signed)

## 2015-02-21 NOTE — Anesthesia Postprocedure Evaluation (Signed)
  Anesthesia Post-op Note  Patient: Carlos Navarro  Procedure(s) Performed: Procedure(s): COLONOSCOPY WITH PROPOFOL (N/A)  Patient Location: Endoscopy Unit  Anesthesia Type:MAC  Level of Consciousness: awake and alert   Airway and Oxygen Therapy: Patient Spontanous Breathing  Post-op Pain: none  Post-op Assessment: Post-op Vital signs reviewed  Post-op Vital Signs: stable  Last Vitals:  Filed Vitals:   02/21/15 0910  BP: 140/82  Pulse:   Temp:   Resp:     Complications: No apparent anesthesia complications

## 2015-02-21 NOTE — H&P (Signed)
Procedure: Surveillance colonoscopy. 07/08/2011 colonoscopy with removal of multiple adenomatous colon polyps  History: The patient is a 79 year old male born [birth year]. He is scheduled to undergo a surveillance colonoscopy today.  Dictation allergies: Codeine causes abdominal pain  Past medical history: Right knee arthroscopy. Lumbar laminectomy. Cervical disc surgery. Left knee arthroscopy. Cholecystectomy. Bilateral carpal tunnel syndrome release surgeries. Tonsillectomy. Laparotomy following blunt trauma. Hemorrhoidectomy. Billroth I gastrectomy. Vagotomy. Coronary artery disease. Myocardial infarction in 1999 area coronary artery stent placement. Kidney stones. Hypertension. Benign prostatic hypertrophy. Ventral hernia. Dumping syndrome. Hypercholesterolemia. Osteoarthritis.  Exam: The patient is alert and lying comfortably on the endoscopy stretcher. Abdomen is soft and nontender to palpation. Lungs are clear to auscultation. Cardiac exam reveals a regular rhythm.  Plan: Proceed with surveillance colonoscopy

## 2015-02-22 ENCOUNTER — Encounter (HOSPITAL_COMMUNITY): Payer: Self-pay | Admitting: Gastroenterology

## 2015-03-13 DIAGNOSIS — I10 Essential (primary) hypertension: Secondary | ICD-10-CM | POA: Diagnosis not present

## 2015-04-24 DIAGNOSIS — M5030 Other cervical disc degeneration, unspecified cervical region: Secondary | ICD-10-CM | POA: Diagnosis not present

## 2015-04-24 DIAGNOSIS — I10 Essential (primary) hypertension: Secondary | ICD-10-CM | POA: Diagnosis not present

## 2015-04-24 DIAGNOSIS — E538 Deficiency of other specified B group vitamins: Secondary | ICD-10-CM | POA: Diagnosis not present

## 2015-04-24 DIAGNOSIS — M179 Osteoarthritis of knee, unspecified: Secondary | ICD-10-CM | POA: Diagnosis not present

## 2015-11-09 DIAGNOSIS — M19042 Primary osteoarthritis, left hand: Secondary | ICD-10-CM | POA: Diagnosis not present

## 2015-11-09 DIAGNOSIS — M19041 Primary osteoarthritis, right hand: Secondary | ICD-10-CM | POA: Diagnosis not present

## 2016-01-08 DIAGNOSIS — Z1389 Encounter for screening for other disorder: Secondary | ICD-10-CM | POA: Diagnosis not present

## 2016-01-08 DIAGNOSIS — Z0000 Encounter for general adult medical examination without abnormal findings: Secondary | ICD-10-CM | POA: Diagnosis not present

## 2016-01-08 DIAGNOSIS — M152 Bouchard's nodes (with arthropathy): Secondary | ICD-10-CM | POA: Diagnosis not present

## 2016-01-08 DIAGNOSIS — I10 Essential (primary) hypertension: Secondary | ICD-10-CM | POA: Diagnosis not present

## 2016-01-08 DIAGNOSIS — E7800 Pure hypercholesterolemia, unspecified: Secondary | ICD-10-CM | POA: Diagnosis not present

## 2016-02-08 DIAGNOSIS — M25579 Pain in unspecified ankle and joints of unspecified foot: Secondary | ICD-10-CM | POA: Diagnosis not present

## 2016-02-08 DIAGNOSIS — S8261XA Displaced fracture of lateral malleolus of right fibula, initial encounter for closed fracture: Secondary | ICD-10-CM | POA: Diagnosis not present

## 2016-02-12 DIAGNOSIS — M25571 Pain in right ankle and joints of right foot: Secondary | ICD-10-CM | POA: Diagnosis not present

## 2016-02-12 DIAGNOSIS — S82831A Other fracture of upper and lower end of right fibula, initial encounter for closed fracture: Secondary | ICD-10-CM | POA: Diagnosis not present

## 2016-02-21 DIAGNOSIS — H6123 Impacted cerumen, bilateral: Secondary | ICD-10-CM | POA: Diagnosis not present

## 2016-02-21 DIAGNOSIS — H903 Sensorineural hearing loss, bilateral: Secondary | ICD-10-CM | POA: Diagnosis not present

## 2016-02-21 DIAGNOSIS — H9312 Tinnitus, left ear: Secondary | ICD-10-CM | POA: Diagnosis not present

## 2016-03-25 DIAGNOSIS — S8261XA Displaced fracture of lateral malleolus of right fibula, initial encounter for closed fracture: Secondary | ICD-10-CM | POA: Diagnosis not present

## 2016-03-25 DIAGNOSIS — S8261XD Displaced fracture of lateral malleolus of right fibula, subsequent encounter for closed fracture with routine healing: Secondary | ICD-10-CM | POA: Diagnosis not present

## 2016-07-08 DIAGNOSIS — I10 Essential (primary) hypertension: Secondary | ICD-10-CM | POA: Diagnosis not present

## 2016-07-08 DIAGNOSIS — N5201 Erectile dysfunction due to arterial insufficiency: Secondary | ICD-10-CM | POA: Diagnosis not present

## 2016-07-08 DIAGNOSIS — E669 Obesity, unspecified: Secondary | ICD-10-CM | POA: Diagnosis not present

## 2016-07-08 DIAGNOSIS — Z6830 Body mass index (BMI) 30.0-30.9, adult: Secondary | ICD-10-CM | POA: Diagnosis not present

## 2016-11-18 DIAGNOSIS — M19041 Primary osteoarthritis, right hand: Secondary | ICD-10-CM | POA: Diagnosis not present

## 2016-11-18 DIAGNOSIS — M19042 Primary osteoarthritis, left hand: Secondary | ICD-10-CM | POA: Diagnosis not present

## 2016-11-18 DIAGNOSIS — M65331 Trigger finger, right middle finger: Secondary | ICD-10-CM | POA: Diagnosis not present

## 2016-12-16 DIAGNOSIS — M19042 Primary osteoarthritis, left hand: Secondary | ICD-10-CM | POA: Diagnosis not present

## 2016-12-16 DIAGNOSIS — M79642 Pain in left hand: Secondary | ICD-10-CM | POA: Diagnosis not present

## 2016-12-16 DIAGNOSIS — M79641 Pain in right hand: Secondary | ICD-10-CM | POA: Diagnosis not present

## 2016-12-16 DIAGNOSIS — M19041 Primary osteoarthritis, right hand: Secondary | ICD-10-CM | POA: Diagnosis not present

## 2017-01-10 DIAGNOSIS — N400 Enlarged prostate without lower urinary tract symptoms: Secondary | ICD-10-CM | POA: Diagnosis not present

## 2017-01-10 DIAGNOSIS — I2510 Atherosclerotic heart disease of native coronary artery without angina pectoris: Secondary | ICD-10-CM | POA: Diagnosis not present

## 2017-01-10 DIAGNOSIS — Z1389 Encounter for screening for other disorder: Secondary | ICD-10-CM | POA: Diagnosis not present

## 2017-01-10 DIAGNOSIS — M152 Bouchard's nodes (with arthropathy): Secondary | ICD-10-CM | POA: Diagnosis not present

## 2017-01-10 DIAGNOSIS — Z0000 Encounter for general adult medical examination without abnormal findings: Secondary | ICD-10-CM | POA: Diagnosis not present

## 2017-01-10 DIAGNOSIS — I10 Essential (primary) hypertension: Secondary | ICD-10-CM | POA: Diagnosis not present

## 2017-01-10 DIAGNOSIS — E7800 Pure hypercholesterolemia, unspecified: Secondary | ICD-10-CM | POA: Diagnosis not present

## 2017-06-02 DIAGNOSIS — M65332 Trigger finger, left middle finger: Secondary | ICD-10-CM | POA: Diagnosis not present

## 2017-06-02 DIAGNOSIS — M79641 Pain in right hand: Secondary | ICD-10-CM | POA: Diagnosis not present

## 2017-06-02 DIAGNOSIS — M19041 Primary osteoarthritis, right hand: Secondary | ICD-10-CM | POA: Diagnosis not present

## 2017-06-02 DIAGNOSIS — M79645 Pain in left finger(s): Secondary | ICD-10-CM | POA: Diagnosis not present

## 2017-06-03 DIAGNOSIS — H43393 Other vitreous opacities, bilateral: Secondary | ICD-10-CM | POA: Diagnosis not present

## 2017-06-03 DIAGNOSIS — H40033 Anatomical narrow angle, bilateral: Secondary | ICD-10-CM | POA: Diagnosis not present

## 2017-06-28 DIAGNOSIS — G44219 Episodic tension-type headache, not intractable: Secondary | ICD-10-CM | POA: Diagnosis not present

## 2017-07-14 DIAGNOSIS — I10 Essential (primary) hypertension: Secondary | ICD-10-CM | POA: Diagnosis not present

## 2017-12-11 DIAGNOSIS — M6530 Trigger finger, unspecified finger: Secondary | ICD-10-CM | POA: Diagnosis not present

## 2017-12-11 DIAGNOSIS — M1811 Unilateral primary osteoarthritis of first carpometacarpal joint, right hand: Secondary | ICD-10-CM | POA: Diagnosis not present

## 2018-01-16 DIAGNOSIS — Z1389 Encounter for screening for other disorder: Secondary | ICD-10-CM | POA: Diagnosis not present

## 2018-01-16 DIAGNOSIS — N400 Enlarged prostate without lower urinary tract symptoms: Secondary | ICD-10-CM | POA: Diagnosis not present

## 2018-01-16 DIAGNOSIS — E538 Deficiency of other specified B group vitamins: Secondary | ICD-10-CM | POA: Diagnosis not present

## 2018-01-16 DIAGNOSIS — E7800 Pure hypercholesterolemia, unspecified: Secondary | ICD-10-CM | POA: Diagnosis not present

## 2018-01-16 DIAGNOSIS — I2510 Atherosclerotic heart disease of native coronary artery without angina pectoris: Secondary | ICD-10-CM | POA: Diagnosis not present

## 2018-01-16 DIAGNOSIS — Z0000 Encounter for general adult medical examination without abnormal findings: Secondary | ICD-10-CM | POA: Diagnosis not present

## 2018-01-16 DIAGNOSIS — I10 Essential (primary) hypertension: Secondary | ICD-10-CM | POA: Diagnosis not present

## 2018-01-16 DIAGNOSIS — I252 Old myocardial infarction: Secondary | ICD-10-CM | POA: Diagnosis not present

## 2018-01-20 DIAGNOSIS — I10 Essential (primary) hypertension: Secondary | ICD-10-CM | POA: Diagnosis not present

## 2018-01-20 DIAGNOSIS — E7800 Pure hypercholesterolemia, unspecified: Secondary | ICD-10-CM | POA: Diagnosis not present

## 2018-01-20 DIAGNOSIS — E538 Deficiency of other specified B group vitamins: Secondary | ICD-10-CM | POA: Diagnosis not present

## 2018-07-27 DIAGNOSIS — M179 Osteoarthritis of knee, unspecified: Secondary | ICD-10-CM | POA: Diagnosis not present

## 2018-07-27 DIAGNOSIS — M152 Bouchard's nodes (with arthropathy): Secondary | ICD-10-CM | POA: Diagnosis not present

## 2018-07-27 DIAGNOSIS — I252 Old myocardial infarction: Secondary | ICD-10-CM | POA: Diagnosis not present

## 2018-07-27 DIAGNOSIS — N400 Enlarged prostate without lower urinary tract symptoms: Secondary | ICD-10-CM | POA: Diagnosis not present

## 2018-07-27 DIAGNOSIS — I10 Essential (primary) hypertension: Secondary | ICD-10-CM | POA: Diagnosis not present

## 2018-07-27 DIAGNOSIS — D510 Vitamin B12 deficiency anemia due to intrinsic factor deficiency: Secondary | ICD-10-CM | POA: Diagnosis not present

## 2018-07-27 DIAGNOSIS — I2510 Atherosclerotic heart disease of native coronary artery without angina pectoris: Secondary | ICD-10-CM | POA: Diagnosis not present

## 2018-07-27 DIAGNOSIS — E7800 Pure hypercholesterolemia, unspecified: Secondary | ICD-10-CM | POA: Diagnosis not present

## 2018-08-20 DIAGNOSIS — I10 Essential (primary) hypertension: Secondary | ICD-10-CM | POA: Diagnosis not present

## 2018-08-20 DIAGNOSIS — M179 Osteoarthritis of knee, unspecified: Secondary | ICD-10-CM | POA: Diagnosis not present

## 2018-08-20 DIAGNOSIS — M152 Bouchard's nodes (with arthropathy): Secondary | ICD-10-CM | POA: Diagnosis not present

## 2018-08-20 DIAGNOSIS — E7800 Pure hypercholesterolemia, unspecified: Secondary | ICD-10-CM | POA: Diagnosis not present

## 2018-08-20 DIAGNOSIS — I2510 Atherosclerotic heart disease of native coronary artery without angina pectoris: Secondary | ICD-10-CM | POA: Diagnosis not present

## 2018-08-20 DIAGNOSIS — I252 Old myocardial infarction: Secondary | ICD-10-CM | POA: Diagnosis not present

## 2018-08-20 DIAGNOSIS — D510 Vitamin B12 deficiency anemia due to intrinsic factor deficiency: Secondary | ICD-10-CM | POA: Diagnosis not present

## 2018-08-20 DIAGNOSIS — N400 Enlarged prostate without lower urinary tract symptoms: Secondary | ICD-10-CM | POA: Diagnosis not present

## 2018-10-07 DIAGNOSIS — I10 Essential (primary) hypertension: Secondary | ICD-10-CM | POA: Diagnosis not present

## 2018-10-07 DIAGNOSIS — I252 Old myocardial infarction: Secondary | ICD-10-CM | POA: Diagnosis not present

## 2018-10-07 DIAGNOSIS — D510 Vitamin B12 deficiency anemia due to intrinsic factor deficiency: Secondary | ICD-10-CM | POA: Diagnosis not present

## 2018-10-07 DIAGNOSIS — M152 Bouchard's nodes (with arthropathy): Secondary | ICD-10-CM | POA: Diagnosis not present

## 2018-10-07 DIAGNOSIS — M179 Osteoarthritis of knee, unspecified: Secondary | ICD-10-CM | POA: Diagnosis not present

## 2018-10-07 DIAGNOSIS — I2510 Atherosclerotic heart disease of native coronary artery without angina pectoris: Secondary | ICD-10-CM | POA: Diagnosis not present

## 2018-10-07 DIAGNOSIS — N400 Enlarged prostate without lower urinary tract symptoms: Secondary | ICD-10-CM | POA: Diagnosis not present

## 2019-01-25 DIAGNOSIS — Z0000 Encounter for general adult medical examination without abnormal findings: Secondary | ICD-10-CM | POA: Diagnosis not present

## 2019-01-25 DIAGNOSIS — E7800 Pure hypercholesterolemia, unspecified: Secondary | ICD-10-CM | POA: Diagnosis not present

## 2019-01-25 DIAGNOSIS — I2510 Atherosclerotic heart disease of native coronary artery without angina pectoris: Secondary | ICD-10-CM | POA: Diagnosis not present

## 2019-01-25 DIAGNOSIS — Z1389 Encounter for screening for other disorder: Secondary | ICD-10-CM | POA: Diagnosis not present

## 2019-01-25 DIAGNOSIS — N400 Enlarged prostate without lower urinary tract symptoms: Secondary | ICD-10-CM | POA: Diagnosis not present

## 2019-01-25 DIAGNOSIS — I10 Essential (primary) hypertension: Secondary | ICD-10-CM | POA: Diagnosis not present

## 2019-07-27 DIAGNOSIS — I10 Essential (primary) hypertension: Secondary | ICD-10-CM | POA: Diagnosis not present

## 2019-07-27 DIAGNOSIS — N400 Enlarged prostate without lower urinary tract symptoms: Secondary | ICD-10-CM | POA: Diagnosis not present

## 2019-10-08 DIAGNOSIS — D510 Vitamin B12 deficiency anemia due to intrinsic factor deficiency: Secondary | ICD-10-CM | POA: Diagnosis not present

## 2019-10-08 DIAGNOSIS — E7800 Pure hypercholesterolemia, unspecified: Secondary | ICD-10-CM | POA: Diagnosis not present

## 2019-10-08 DIAGNOSIS — M152 Bouchard's nodes (with arthropathy): Secondary | ICD-10-CM | POA: Diagnosis not present

## 2019-10-08 DIAGNOSIS — I10 Essential (primary) hypertension: Secondary | ICD-10-CM | POA: Diagnosis not present

## 2019-10-08 DIAGNOSIS — I2510 Atherosclerotic heart disease of native coronary artery without angina pectoris: Secondary | ICD-10-CM | POA: Diagnosis not present

## 2019-10-08 DIAGNOSIS — I252 Old myocardial infarction: Secondary | ICD-10-CM | POA: Diagnosis not present

## 2019-10-08 DIAGNOSIS — M179 Osteoarthritis of knee, unspecified: Secondary | ICD-10-CM | POA: Diagnosis not present

## 2019-10-08 DIAGNOSIS — N400 Enlarged prostate without lower urinary tract symptoms: Secondary | ICD-10-CM | POA: Diagnosis not present

## 2020-02-04 DIAGNOSIS — N400 Enlarged prostate without lower urinary tract symptoms: Secondary | ICD-10-CM | POA: Diagnosis not present

## 2020-02-04 DIAGNOSIS — I10 Essential (primary) hypertension: Secondary | ICD-10-CM | POA: Diagnosis not present

## 2020-02-04 DIAGNOSIS — D510 Vitamin B12 deficiency anemia due to intrinsic factor deficiency: Secondary | ICD-10-CM | POA: Diagnosis not present

## 2020-02-04 DIAGNOSIS — M179 Osteoarthritis of knee, unspecified: Secondary | ICD-10-CM | POA: Diagnosis not present

## 2020-02-04 DIAGNOSIS — I252 Old myocardial infarction: Secondary | ICD-10-CM | POA: Diagnosis not present

## 2020-02-04 DIAGNOSIS — E7800 Pure hypercholesterolemia, unspecified: Secondary | ICD-10-CM | POA: Diagnosis not present

## 2020-02-04 DIAGNOSIS — M152 Bouchard's nodes (with arthropathy): Secondary | ICD-10-CM | POA: Diagnosis not present

## 2020-02-04 DIAGNOSIS — I2510 Atherosclerotic heart disease of native coronary artery without angina pectoris: Secondary | ICD-10-CM | POA: Diagnosis not present

## 2020-03-21 DIAGNOSIS — R252 Cramp and spasm: Secondary | ICD-10-CM | POA: Diagnosis not present

## 2020-03-21 DIAGNOSIS — Z1389 Encounter for screening for other disorder: Secondary | ICD-10-CM | POA: Diagnosis not present

## 2020-03-21 DIAGNOSIS — Z0000 Encounter for general adult medical examination without abnormal findings: Secondary | ICD-10-CM | POA: Diagnosis not present

## 2020-03-21 DIAGNOSIS — I10 Essential (primary) hypertension: Secondary | ICD-10-CM | POA: Diagnosis not present

## 2020-03-21 DIAGNOSIS — I2510 Atherosclerotic heart disease of native coronary artery without angina pectoris: Secondary | ICD-10-CM | POA: Diagnosis not present

## 2020-03-21 DIAGNOSIS — E7800 Pure hypercholesterolemia, unspecified: Secondary | ICD-10-CM | POA: Diagnosis not present

## 2020-03-21 DIAGNOSIS — D510 Vitamin B12 deficiency anemia due to intrinsic factor deficiency: Secondary | ICD-10-CM | POA: Diagnosis not present

## 2020-03-21 DIAGNOSIS — N400 Enlarged prostate without lower urinary tract symptoms: Secondary | ICD-10-CM | POA: Diagnosis not present

## 2020-04-28 DIAGNOSIS — R252 Cramp and spasm: Secondary | ICD-10-CM | POA: Diagnosis not present

## 2020-05-23 DIAGNOSIS — Z974 Presence of external hearing-aid: Secondary | ICD-10-CM | POA: Diagnosis not present

## 2020-05-23 DIAGNOSIS — Z9089 Acquired absence of other organs: Secondary | ICD-10-CM | POA: Diagnosis not present

## 2020-05-23 DIAGNOSIS — H6123 Impacted cerumen, bilateral: Secondary | ICD-10-CM | POA: Diagnosis not present

## 2020-05-23 DIAGNOSIS — H9113 Presbycusis, bilateral: Secondary | ICD-10-CM | POA: Diagnosis not present

## 2020-06-22 DIAGNOSIS — M19041 Primary osteoarthritis, right hand: Secondary | ICD-10-CM | POA: Diagnosis not present

## 2020-06-22 DIAGNOSIS — M19042 Primary osteoarthritis, left hand: Secondary | ICD-10-CM | POA: Diagnosis not present

## 2020-06-22 DIAGNOSIS — M7541 Impingement syndrome of right shoulder: Secondary | ICD-10-CM | POA: Diagnosis not present

## 2020-06-27 DIAGNOSIS — M79672 Pain in left foot: Secondary | ICD-10-CM | POA: Diagnosis not present

## 2020-06-27 DIAGNOSIS — M779 Enthesopathy, unspecified: Secondary | ICD-10-CM | POA: Diagnosis not present

## 2020-07-11 DIAGNOSIS — M2041 Other hammer toe(s) (acquired), right foot: Secondary | ICD-10-CM | POA: Diagnosis not present

## 2020-07-11 DIAGNOSIS — M79675 Pain in left toe(s): Secondary | ICD-10-CM | POA: Diagnosis not present

## 2020-07-14 DIAGNOSIS — M152 Bouchard's nodes (with arthropathy): Secondary | ICD-10-CM | POA: Diagnosis not present

## 2020-07-14 DIAGNOSIS — I252 Old myocardial infarction: Secondary | ICD-10-CM | POA: Diagnosis not present

## 2020-07-14 DIAGNOSIS — I2510 Atherosclerotic heart disease of native coronary artery without angina pectoris: Secondary | ICD-10-CM | POA: Diagnosis not present

## 2020-07-14 DIAGNOSIS — D510 Vitamin B12 deficiency anemia due to intrinsic factor deficiency: Secondary | ICD-10-CM | POA: Diagnosis not present

## 2020-07-14 DIAGNOSIS — E7800 Pure hypercholesterolemia, unspecified: Secondary | ICD-10-CM | POA: Diagnosis not present

## 2020-07-14 DIAGNOSIS — M179 Osteoarthritis of knee, unspecified: Secondary | ICD-10-CM | POA: Diagnosis not present

## 2020-07-14 DIAGNOSIS — I10 Essential (primary) hypertension: Secondary | ICD-10-CM | POA: Diagnosis not present

## 2020-07-14 DIAGNOSIS — N400 Enlarged prostate without lower urinary tract symptoms: Secondary | ICD-10-CM | POA: Diagnosis not present

## 2020-07-18 DIAGNOSIS — M19042 Primary osteoarthritis, left hand: Secondary | ICD-10-CM | POA: Diagnosis not present

## 2020-07-18 DIAGNOSIS — M19041 Primary osteoarthritis, right hand: Secondary | ICD-10-CM | POA: Diagnosis not present

## 2020-07-25 DIAGNOSIS — M7541 Impingement syndrome of right shoulder: Secondary | ICD-10-CM | POA: Diagnosis not present

## 2020-07-25 DIAGNOSIS — M25511 Pain in right shoulder: Secondary | ICD-10-CM | POA: Diagnosis not present

## 2020-08-08 DIAGNOSIS — M25511 Pain in right shoulder: Secondary | ICD-10-CM | POA: Diagnosis not present

## 2020-08-08 DIAGNOSIS — M7541 Impingement syndrome of right shoulder: Secondary | ICD-10-CM | POA: Diagnosis not present

## 2020-08-09 DIAGNOSIS — M7541 Impingement syndrome of right shoulder: Secondary | ICD-10-CM | POA: Diagnosis not present

## 2020-08-09 DIAGNOSIS — M67921 Unspecified disorder of synovium and tendon, right upper arm: Secondary | ICD-10-CM | POA: Diagnosis not present

## 2020-08-09 DIAGNOSIS — M75121 Complete rotator cuff tear or rupture of right shoulder, not specified as traumatic: Secondary | ICD-10-CM | POA: Diagnosis not present

## 2020-08-22 DIAGNOSIS — Z01818 Encounter for other preprocedural examination: Secondary | ICD-10-CM | POA: Diagnosis not present

## 2020-08-24 DIAGNOSIS — M75101 Unspecified rotator cuff tear or rupture of right shoulder, not specified as traumatic: Secondary | ICD-10-CM | POA: Diagnosis not present

## 2020-08-24 DIAGNOSIS — K219 Gastro-esophageal reflux disease without esophagitis: Secondary | ICD-10-CM | POA: Diagnosis not present

## 2020-08-24 DIAGNOSIS — M75121 Complete rotator cuff tear or rupture of right shoulder, not specified as traumatic: Secondary | ICD-10-CM | POA: Diagnosis not present

## 2020-08-24 DIAGNOSIS — G8918 Other acute postprocedural pain: Secondary | ICD-10-CM | POA: Diagnosis not present

## 2020-08-24 DIAGNOSIS — I2510 Atherosclerotic heart disease of native coronary artery without angina pectoris: Secondary | ICD-10-CM | POA: Diagnosis not present

## 2020-08-24 DIAGNOSIS — Z955 Presence of coronary angioplasty implant and graft: Secondary | ICD-10-CM | POA: Diagnosis not present

## 2020-08-24 DIAGNOSIS — M67921 Unspecified disorder of synovium and tendon, right upper arm: Secondary | ICD-10-CM | POA: Diagnosis not present

## 2020-08-24 DIAGNOSIS — I10 Essential (primary) hypertension: Secondary | ICD-10-CM | POA: Diagnosis not present

## 2020-08-24 DIAGNOSIS — M19042 Primary osteoarthritis, left hand: Secondary | ICD-10-CM | POA: Diagnosis not present

## 2020-08-24 DIAGNOSIS — M19041 Primary osteoarthritis, right hand: Secondary | ICD-10-CM | POA: Diagnosis not present

## 2020-08-24 DIAGNOSIS — M7541 Impingement syndrome of right shoulder: Secondary | ICD-10-CM | POA: Diagnosis not present

## 2020-08-24 DIAGNOSIS — Z79899 Other long term (current) drug therapy: Secondary | ICD-10-CM | POA: Diagnosis not present

## 2020-08-24 DIAGNOSIS — M19011 Primary osteoarthritis, right shoulder: Secondary | ICD-10-CM | POA: Diagnosis not present

## 2020-08-24 DIAGNOSIS — I252 Old myocardial infarction: Secondary | ICD-10-CM | POA: Diagnosis not present

## 2020-08-24 DIAGNOSIS — M7521 Bicipital tendinitis, right shoulder: Secondary | ICD-10-CM | POA: Diagnosis not present

## 2020-09-04 DIAGNOSIS — M7541 Impingement syndrome of right shoulder: Secondary | ICD-10-CM | POA: Diagnosis not present

## 2020-09-11 DIAGNOSIS — N400 Enlarged prostate without lower urinary tract symptoms: Secondary | ICD-10-CM | POA: Diagnosis not present

## 2020-09-11 DIAGNOSIS — I10 Essential (primary) hypertension: Secondary | ICD-10-CM | POA: Diagnosis not present

## 2020-09-11 DIAGNOSIS — I2510 Atherosclerotic heart disease of native coronary artery without angina pectoris: Secondary | ICD-10-CM | POA: Diagnosis not present

## 2020-09-11 DIAGNOSIS — I252 Old myocardial infarction: Secondary | ICD-10-CM | POA: Diagnosis not present

## 2020-09-11 DIAGNOSIS — M179 Osteoarthritis of knee, unspecified: Secondary | ICD-10-CM | POA: Diagnosis not present

## 2020-09-11 DIAGNOSIS — E7800 Pure hypercholesterolemia, unspecified: Secondary | ICD-10-CM | POA: Diagnosis not present

## 2020-09-12 DIAGNOSIS — M2042 Other hammer toe(s) (acquired), left foot: Secondary | ICD-10-CM | POA: Diagnosis not present

## 2020-09-12 DIAGNOSIS — M79675 Pain in left toe(s): Secondary | ICD-10-CM | POA: Diagnosis not present

## 2020-09-14 DIAGNOSIS — Z23 Encounter for immunization: Secondary | ICD-10-CM | POA: Diagnosis not present

## 2020-09-21 DIAGNOSIS — M152 Bouchard's nodes (with arthropathy): Secondary | ICD-10-CM | POA: Diagnosis not present

## 2020-09-21 DIAGNOSIS — I252 Old myocardial infarction: Secondary | ICD-10-CM | POA: Diagnosis not present

## 2020-09-21 DIAGNOSIS — N400 Enlarged prostate without lower urinary tract symptoms: Secondary | ICD-10-CM | POA: Diagnosis not present

## 2020-09-21 DIAGNOSIS — D510 Vitamin B12 deficiency anemia due to intrinsic factor deficiency: Secondary | ICD-10-CM | POA: Diagnosis not present

## 2020-09-21 DIAGNOSIS — E7800 Pure hypercholesterolemia, unspecified: Secondary | ICD-10-CM | POA: Diagnosis not present

## 2020-09-21 DIAGNOSIS — I2510 Atherosclerotic heart disease of native coronary artery without angina pectoris: Secondary | ICD-10-CM | POA: Diagnosis not present

## 2020-09-21 DIAGNOSIS — M179 Osteoarthritis of knee, unspecified: Secondary | ICD-10-CM | POA: Diagnosis not present

## 2020-09-21 DIAGNOSIS — I10 Essential (primary) hypertension: Secondary | ICD-10-CM | POA: Diagnosis not present

## 2020-10-18 DIAGNOSIS — S46211A Strain of muscle, fascia and tendon of other parts of biceps, right arm, initial encounter: Secondary | ICD-10-CM | POA: Diagnosis not present

## 2020-12-21 DIAGNOSIS — I252 Old myocardial infarction: Secondary | ICD-10-CM | POA: Diagnosis not present

## 2020-12-21 DIAGNOSIS — D510 Vitamin B12 deficiency anemia due to intrinsic factor deficiency: Secondary | ICD-10-CM | POA: Diagnosis not present

## 2020-12-21 DIAGNOSIS — E7800 Pure hypercholesterolemia, unspecified: Secondary | ICD-10-CM | POA: Diagnosis not present

## 2020-12-21 DIAGNOSIS — I10 Essential (primary) hypertension: Secondary | ICD-10-CM | POA: Diagnosis not present

## 2020-12-21 DIAGNOSIS — M179 Osteoarthritis of knee, unspecified: Secondary | ICD-10-CM | POA: Diagnosis not present

## 2020-12-21 DIAGNOSIS — N400 Enlarged prostate without lower urinary tract symptoms: Secondary | ICD-10-CM | POA: Diagnosis not present

## 2020-12-21 DIAGNOSIS — M152 Bouchard's nodes (with arthropathy): Secondary | ICD-10-CM | POA: Diagnosis not present

## 2020-12-21 DIAGNOSIS — I2510 Atherosclerotic heart disease of native coronary artery without angina pectoris: Secondary | ICD-10-CM | POA: Diagnosis not present

## 2021-01-04 DIAGNOSIS — M19041 Primary osteoarthritis, right hand: Secondary | ICD-10-CM | POA: Diagnosis not present

## 2021-01-24 DIAGNOSIS — M1712 Unilateral primary osteoarthritis, left knee: Secondary | ICD-10-CM | POA: Diagnosis not present

## 2021-02-20 DIAGNOSIS — I252 Old myocardial infarction: Secondary | ICD-10-CM | POA: Diagnosis not present

## 2021-02-20 DIAGNOSIS — M1712 Unilateral primary osteoarthritis, left knee: Secondary | ICD-10-CM | POA: Diagnosis not present

## 2021-02-20 DIAGNOSIS — N400 Enlarged prostate without lower urinary tract symptoms: Secondary | ICD-10-CM | POA: Diagnosis not present

## 2021-02-20 DIAGNOSIS — I10 Essential (primary) hypertension: Secondary | ICD-10-CM | POA: Diagnosis not present

## 2021-02-20 DIAGNOSIS — I2510 Atherosclerotic heart disease of native coronary artery without angina pectoris: Secondary | ICD-10-CM | POA: Diagnosis not present

## 2021-02-20 DIAGNOSIS — D510 Vitamin B12 deficiency anemia due to intrinsic factor deficiency: Secondary | ICD-10-CM | POA: Diagnosis not present

## 2021-02-20 DIAGNOSIS — M179 Osteoarthritis of knee, unspecified: Secondary | ICD-10-CM | POA: Diagnosis not present

## 2021-02-20 DIAGNOSIS — M152 Bouchard's nodes (with arthropathy): Secondary | ICD-10-CM | POA: Diagnosis not present

## 2021-02-20 DIAGNOSIS — E7800 Pure hypercholesterolemia, unspecified: Secondary | ICD-10-CM | POA: Diagnosis not present

## 2021-03-07 DIAGNOSIS — E7800 Pure hypercholesterolemia, unspecified: Secondary | ICD-10-CM | POA: Diagnosis not present

## 2021-03-26 DIAGNOSIS — R252 Cramp and spasm: Secondary | ICD-10-CM | POA: Diagnosis not present

## 2021-03-26 DIAGNOSIS — Z0000 Encounter for general adult medical examination without abnormal findings: Secondary | ICD-10-CM | POA: Diagnosis not present

## 2021-03-26 DIAGNOSIS — Z1389 Encounter for screening for other disorder: Secondary | ICD-10-CM | POA: Diagnosis not present

## 2021-03-26 DIAGNOSIS — E538 Deficiency of other specified B group vitamins: Secondary | ICD-10-CM | POA: Diagnosis not present

## 2021-03-26 DIAGNOSIS — I2510 Atherosclerotic heart disease of native coronary artery without angina pectoris: Secondary | ICD-10-CM | POA: Diagnosis not present

## 2021-03-26 DIAGNOSIS — R8290 Unspecified abnormal findings in urine: Secondary | ICD-10-CM | POA: Diagnosis not present

## 2021-03-26 DIAGNOSIS — E7800 Pure hypercholesterolemia, unspecified: Secondary | ICD-10-CM | POA: Diagnosis not present

## 2021-03-26 DIAGNOSIS — I10 Essential (primary) hypertension: Secondary | ICD-10-CM | POA: Diagnosis not present

## 2021-03-26 DIAGNOSIS — N400 Enlarged prostate without lower urinary tract symptoms: Secondary | ICD-10-CM | POA: Diagnosis not present

## 2021-05-03 DIAGNOSIS — M1712 Unilateral primary osteoarthritis, left knee: Secondary | ICD-10-CM | POA: Diagnosis not present

## 2021-05-03 DIAGNOSIS — M19042 Primary osteoarthritis, left hand: Secondary | ICD-10-CM | POA: Diagnosis not present

## 2021-05-03 DIAGNOSIS — M19041 Primary osteoarthritis, right hand: Secondary | ICD-10-CM | POA: Diagnosis not present

## 2021-05-18 ENCOUNTER — Other Ambulatory Visit: Payer: Self-pay

## 2021-05-18 ENCOUNTER — Emergency Department (HOSPITAL_COMMUNITY): Payer: Medicare Other

## 2021-05-18 ENCOUNTER — Emergency Department (HOSPITAL_COMMUNITY)
Admission: EM | Admit: 2021-05-18 | Discharge: 2021-05-18 | Disposition: A | Discharge status: Home / Self Care | Payer: Medicare Other | Attending: Emergency Medicine | Admitting: Emergency Medicine

## 2021-05-18 DIAGNOSIS — S52592A Other fractures of lower end of left radius, initial encounter for closed fracture: Secondary | ICD-10-CM | POA: Diagnosis not present

## 2021-05-18 DIAGNOSIS — R079 Chest pain, unspecified: Secondary | ICD-10-CM | POA: Insufficient documentation

## 2021-05-18 DIAGNOSIS — S0990XA Unspecified injury of head, initial encounter: Secondary | ICD-10-CM | POA: Diagnosis not present

## 2021-05-18 DIAGNOSIS — S0083XA Contusion of other part of head, initial encounter: Secondary | ICD-10-CM | POA: Insufficient documentation

## 2021-05-18 DIAGNOSIS — M25512 Pain in left shoulder: Secondary | ICD-10-CM | POA: Diagnosis not present

## 2021-05-18 DIAGNOSIS — M79643 Pain in unspecified hand: Secondary | ICD-10-CM | POA: Diagnosis not present

## 2021-05-18 DIAGNOSIS — S199XXA Unspecified injury of neck, initial encounter: Secondary | ICD-10-CM | POA: Diagnosis not present

## 2021-05-18 DIAGNOSIS — M79642 Pain in left hand: Secondary | ICD-10-CM | POA: Insufficient documentation

## 2021-05-18 DIAGNOSIS — R0902 Hypoxemia: Secondary | ICD-10-CM | POA: Diagnosis not present

## 2021-05-18 DIAGNOSIS — W109XXA Fall (on) (from) unspecified stairs and steps, initial encounter: Secondary | ICD-10-CM | POA: Insufficient documentation

## 2021-05-18 DIAGNOSIS — Z981 Arthrodesis status: Secondary | ICD-10-CM | POA: Diagnosis not present

## 2021-05-18 DIAGNOSIS — S52502A Unspecified fracture of the lower end of left radius, initial encounter for closed fracture: Secondary | ICD-10-CM | POA: Diagnosis not present

## 2021-05-18 DIAGNOSIS — Z79899 Other long term (current) drug therapy: Secondary | ICD-10-CM | POA: Diagnosis not present

## 2021-05-18 DIAGNOSIS — W19XXXA Unspecified fall, initial encounter: Secondary | ICD-10-CM | POA: Diagnosis not present

## 2021-05-18 DIAGNOSIS — I10 Essential (primary) hypertension: Secondary | ICD-10-CM | POA: Insufficient documentation

## 2021-05-18 DIAGNOSIS — T1490XA Injury, unspecified, initial encounter: Secondary | ICD-10-CM

## 2021-05-18 DIAGNOSIS — M7989 Other specified soft tissue disorders: Secondary | ICD-10-CM | POA: Diagnosis not present

## 2021-05-18 DIAGNOSIS — R0689 Other abnormalities of breathing: Secondary | ICD-10-CM | POA: Diagnosis not present

## 2021-05-18 DIAGNOSIS — S6992XA Unspecified injury of left wrist, hand and finger(s), initial encounter: Secondary | ICD-10-CM | POA: Diagnosis present

## 2021-05-18 DIAGNOSIS — R0781 Pleurodynia: Secondary | ICD-10-CM | POA: Diagnosis not present

## 2021-05-18 MED ORDER — HYDROCODONE-ACETAMINOPHEN 5-325 MG PO TABS: 2.0000 | ORAL_TABLET | ORAL | 0 refills | Status: DC | PRN

## 2021-05-18 MED ORDER — FENTANYL CITRATE (PF) 100 MCG/2ML IJ SOLN
50.0000 ug | Freq: Once | INTRAMUSCULAR | Status: AC
  Administered 2021-05-18: 50 ug via INTRAVENOUS
  Filled 2021-05-18: qty 2

## 2021-05-18 MED ORDER — ONDANSETRON HCL 4 MG/2ML IJ SOLN
4.0000 mg | Freq: Once | INTRAMUSCULAR | Status: AC
  Administered 2021-05-18: 4 mg via INTRAVENOUS
  Filled 2021-05-18: qty 2

## 2021-05-18 NOTE — ED Provider Notes (Signed)
SPLINT APPLICATION Date/Time: 4:03 PM Authorized by: Tacy Learn Consent: Verbal consent obtained. Risks and benefits: risks, benefits and alternatives were discussed Consent given by: patient Splint applied by: orthopedic technician Location details: left forearm Splint type: OCL, short arm, volar Supplies used: ace, fiberglass OCL Post-procedure: The splinted body part was neurovascularly unchanged following the procedure. Patient tolerance: Patient tolerated the procedure well with no immediate complications.     Tacy Learn, PA-C 05/18/21 2136    Luna Fuse, MD 05/25/21 [phone]

## 2021-05-18 NOTE — ED Notes (Signed)
Splint completed. Cap refill less than 3sec

## 2021-05-18 NOTE — ED Notes (Signed)
Splint checked by PA

## 2021-05-18 NOTE — ED Triage Notes (Signed)
Patient arrives via EMS, status post fall down ~ 2 stairs outside, injury to left hand, shoulder, left chest and left side of head, patient stated that he "blacked out". Stated that the porch he fell down from is about  four foot high.

## 2021-05-18 NOTE — ED Provider Notes (Signed)
[facility] EMERGENCY DEPARTMENT Provider Note   CSN: [id number] Arrival date & time: 05/18/21  1756     History Chief Complaint  Patient presents with   Carlos Navarro is a 85 y.o. male.  HPI  Patient presents with injuries from a fall.  He says he fell down 2 stairs outside roughly 4 feet.  He is not on blood thinners, but he did hit his head.  He thinks he lost consciousness, but is unsure for how long.  He also reports pain to the left shoulder and left hand.  No pain to the left elbow.  Complains of chest pain to the left side worse when he breathes in.  Although his knees are scratched up, he is able to move them and the pain is not as severe as on his chest and left shoulder.  Past Medical History:  Diagnosis Date   Adenomatous colon polyp    ASHD (arteriosclerotic heart disease)    S/P DMI in oct 1999 w/ stent placed in the RCA   BPH (benign prostatic hyperplasia)    Cervical disc disease    Dermatitis    on hands   Diverticulosis    DJD (degenerative joint disease)    knees   Dumping syndrome    secondary   ED (erectile dysfunction)    Epididymitis    on the left   H/O vagotomy    HTN (hypertension)    Hypercholesterolemia    Kidney stone on right side    Low back pain    Odynophagia    chronic w/ chest burning   Peripheral edema    Tinnitus    Ventral hernia     Patient Active Problem List   Diagnosis Date Noted   DEGENERATIVE [place] DISEASE 11/29/2008   SPONDYLOSIS, LUMBAR, WITH RADICULOPATHY 11/29/2008   DYSURIA 11/29/2008   OTHER POSTOPERATIVE INFECTION NEC 11/29/2008   OTHER POSTSURGICAL STATUS OTHER 11/29/2008    Past Surgical History:  Procedure Laterality Date   ANTRECTOMY     APPENDECTOMY     BACK SURGERY     Billroth I anastomosis     CERVICAL DISC SURGERY     x 2   CHOLECYSTECTOMY     COLONOSCOPY WITH PROPOFOL N/A 02/21/2015   Procedure: COLONOSCOPY WITH PROPOFOL;  Surgeon: Garlan Fair, MD;  Location: [facility] ENDOSCOPY;   Service: Endoscopy;  Laterality: N/A;   HEMORRHOID SURGERY     repair of spinal column  2009   punctured spinal cord and leaking spinal fluid-had to repair   TONSILLECTOMY         Family History  Problem Relation Age of Onset   CAD Mother    Cancer - Colon Brother    Alzheimer's disease Brother    Cancer Sister        breast   Gout Sister    Diabetes Sister    CAD Sister     Social History   Tobacco Use   Smoking status: Never  Substance Use Topics   Alcohol use: Yes    Comment: occassionally   Drug use: No    Home Medications Prior to Admission medications   Medication Sig Start Date End Date Taking? Authorizing Provider  acetaminophen (TYLENOL) 500 MG tablet Take 1,500 mg by mouth daily as needed (Pain).     [provider]  ibuprofen (ADVIL,MOTRIN) 200 MG tablet Take 600 mg by mouth 2 (two) times daily.     [provider]  ramipril (ALTACE) 10 MG capsule Take 10 mg by mouth daily.    [provider]  simvastatin (ZOCOR) 20 MG tablet Take 20 mg by mouth daily.    [provider]  tamsulosin (FLOMAX) 0.4 MG CAPS capsule Take 0.4 mg by mouth daily. Take 30 mins before the same meal every day.    [provider]  vitamin B-12 (CYANOCOBALAMIN) 1000 MCG tablet Take 1,000 mcg by mouth daily.    [provider]    Allergies    Codeine and Pravastatin sodium  Review of Systems   Review of Systems  Constitutional:  Negative for chills, fatigue and fever.  HENT:  Negative for ear pain and sore throat.   Eyes:  Negative for photophobia, pain and visual disturbance.  Respiratory:  Negative for cough and shortness of breath.   Cardiovascular:  Negative for chest pain and palpitations.  Gastrointestinal:  Negative for abdominal pain and vomiting.  Genitourinary:  Negative for dysuria and hematuria.  Musculoskeletal:  Positive for arthralgias and neck pain. Negative for back pain and gait problem.       Shoulder pain,  hand pain, left rib pain  Skin:  Negative for color change and rash.  Neurological:  Negative for dizziness, seizures, syncope, weakness and headaches.  All other systems reviewed and are negative.  Physical Exam Updated Vital Signs BP 138/79   Pulse 70   Temp 98.1 F (36.7 C) (Oral)   Resp 18   Ht 5\' 11"  (1.803 m)   Wt 96.1 kg   SpO2 97%   BMI 29.55 kg/m   Physical Exam Vitals and nursing note reviewed. Exam conducted with a chaperone present.  Constitutional:      Appearance: Normal appearance. He is well-developed.     Comments: In C-spine  HENT:     Head: Normocephalic and atraumatic.     Comments: Small contusion and avulsion to forehead. Not actively bleeding.  Eyes:     General: No scleral icterus.       Right eye: No discharge.        Left eye: No discharge.     Extraocular Movements: Extraocular movements intact.     Conjunctiva/sclera: Conjunctivae normal.     Pupils: Pupils are equal, round, and reactive to light.     Comments: EOMI in tact. No pupil dilation. No globe trauma.   Neck:     Comments: ROM flexion and extension wnl. Internal and external rotation limited due to pain. Maybe 20 degrees. No bony tenderness to midline. No obvious palpable deformity  Cardiovascular:     Rate and Rhythm: Normal rate and regular rhythm.     Pulses: Normal pulses.     Heart sounds: Normal heart sounds. No murmur heard.   No friction rub. No gallop.     Comments: DP/PT, radial pulses 2+ bilaterally  Pulmonary:     Effort: Pulmonary effort is normal. No respiratory distress.     Breath sounds: Normal breath sounds.  Abdominal:     General: Abdomen is flat. Bowel sounds are normal. There is no distension.     Palpations: Abdomen is soft.     Tenderness: There is no abdominal tenderness.     Hernia: A hernia is present.  Musculoskeletal:        General: Tenderness and signs of injury present.     Cervical back: Neck supple. Tenderness present.     Comments: No spinal  tenderness. Left hand grip strength weaker. Unable to form first  due to pain. Point tenderness to left shoulder. Decreased ROM. Avulsions to knee bilaterally. Full ROM to knee and leg raise, but causes pain to the ribs.   Skin:    General: Skin is warm and dry.     Capillary Refill: Capillary refill takes less than 2 seconds.     Coloration: Skin is not jaundiced.     Findings: Bruising and lesion present.  Neurological:     General: No focal deficit present.     Mental Status: He is alert and oriented to person, place, and time. Mental status is at baseline.     Cranial Nerves: No cranial nerve deficit.     Motor: Weakness present.     Coordination: Coordination normal.     Comments: Grip strength weaker, but likely to MSK rather than nervous     ED Results / Procedures / Treatments   Labs (all labs ordered are listed, but only abnormal results are displayed) Labs Reviewed - No data to display  EKG None  Radiology No results found.  Procedures Procedures   Medications Ordered in ED Medications  ondansetron (ZOFRAN) injection 4 mg (has no administration in time range)  fentaNYL (SUBLIMAZE) injection 50 mcg (has no administration in time range)    ED Course  I have reviewed the triage vital signs and the nursing notes.  Pertinent labs & imaging results that were available during my care of the patient were reviewed by me and considered in my medical decision making (see chart for details).    MDM Rules/Calculators/A&P                          Patient is an 85 year old male presenting for mechanical fall and associated injuries.  He is not on any blood thinners.  He is mildly tachypneic to 21 but otherwise vitals appear stable.  Physical exam findings are detailed above, however, I have ordered imaging as I feel is appropriate.  Based on my physical exam, his knees and lower extremity does not appear to need imaging.  He has full range of motion, neurovascularly intact,  pulses are strong bilaterally.  I am not concerned about a fracture to his lower extremities, he is also able to ambulate.  He is neurologically intact, without any focal neurodeficits.  Overall, neuro exam is reassuring.  However, given his age and fall and contusion to forehead I think CT head and neck is necessary.  Other images include shoulder, ribs, left hand.  CT head is without signs of acute trauma; no SAH, no epidural or subdural hemorrhage, no skull fracture.  CT neck is without signs of disc herniation, acute trauma, vertebral fracture.  Overall very reassuring.  Hand x-rays notable for a fracture to the radial metaphysis. Other imaging negative.   I prescribed the patient pain medicine, and ordered a splint application.  He already has an orthopedic physician who he intends to follow-up with, but I have given him an alternative in case he needs it.  Patient is in agreement to call Monday morning to schedule an appointment.  Instructions given to allow the brain to rest over the weekend.  Return precautions were given.  Patient is in agreement with plan and verbalized understanding.   Final Clinical Impression(s) / ED Diagnoses Final diagnoses:  Trauma    Rx / DC Orders ED Discharge Orders     None        Sherrill Raring, [place] 05/18/21 2053  Luna Fuse, MD 05/18/21 2103

## 2021-05-18 NOTE — Discharge Instructions (Addendum)
You are seen today in the emergency department for injury sustained from a fall.  Your head CT did not show signs of a brain bleed.  There were no other additional fractures to your ribs that were noted on the x-ray.  You do have a fracture to the radial bone.  We are putting you in a brace and giving you some pain medicine to take for the next 5 days.  Please call your orthopedic doctor and schedule follow-up appointment Monday morning.  If you are unable to get in touch with that office what ever reason please follow-up with Dr. Aline Brochure who is an orthopedic doctor here in [place].  If you develop vomiting, severe headache, and overall change in mental status please return back to the emergency department for evaluation.  It is possible he sustained a concussion from the fall today.  You need to let your brain rest over the weekend.  That means no television or stimulating conversation.

## 2021-05-21 MED FILL — Hydrocodone-Acetaminophen Tab 5-325 MG: ORAL | Qty: 6 | Status: AC

## 2021-05-24 DIAGNOSIS — S52572A Other intraarticular fracture of lower end of left radius, initial encounter for closed fracture: Secondary | ICD-10-CM | POA: Diagnosis not present

## 2021-05-24 DIAGNOSIS — M25561 Pain in right knee: Secondary | ICD-10-CM | POA: Diagnosis not present

## 2021-05-24 DIAGNOSIS — M25562 Pain in left knee: Secondary | ICD-10-CM | POA: Diagnosis not present

## 2021-05-25 DIAGNOSIS — S20212A Contusion of left front wall of thorax, initial encounter: Secondary | ICD-10-CM | POA: Diagnosis not present

## 2021-05-31 DIAGNOSIS — S52572D Other intraarticular fracture of lower end of left radius, subsequent encounter for closed fracture with routine healing: Secondary | ICD-10-CM | POA: Diagnosis not present

## 2021-06-01 DIAGNOSIS — I2510 Atherosclerotic heart disease of native coronary artery without angina pectoris: Secondary | ICD-10-CM | POA: Diagnosis not present

## 2021-06-01 DIAGNOSIS — D510 Vitamin B12 deficiency anemia due to intrinsic factor deficiency: Secondary | ICD-10-CM | POA: Diagnosis not present

## 2021-06-01 DIAGNOSIS — E7800 Pure hypercholesterolemia, unspecified: Secondary | ICD-10-CM | POA: Diagnosis not present

## 2021-06-01 DIAGNOSIS — N400 Enlarged prostate without lower urinary tract symptoms: Secondary | ICD-10-CM | POA: Diagnosis not present

## 2021-06-01 DIAGNOSIS — I10 Essential (primary) hypertension: Secondary | ICD-10-CM | POA: Diagnosis not present

## 2021-06-01 DIAGNOSIS — I252 Old myocardial infarction: Secondary | ICD-10-CM | POA: Diagnosis not present

## 2021-06-02 DIAGNOSIS — Z23 Encounter for immunization: Secondary | ICD-10-CM | POA: Diagnosis not present

## 2021-06-07 DIAGNOSIS — S52572D Other intraarticular fracture of lower end of left radius, subsequent encounter for closed fracture with routine healing: Secondary | ICD-10-CM | POA: Diagnosis not present

## 2021-06-25 DIAGNOSIS — S52572D Other intraarticular fracture of lower end of left radius, subsequent encounter for closed fracture with routine healing: Secondary | ICD-10-CM | POA: Diagnosis not present

## 2021-07-20 DIAGNOSIS — M19041 Primary osteoarthritis, right hand: Secondary | ICD-10-CM | POA: Diagnosis not present

## 2021-07-20 DIAGNOSIS — M19042 Primary osteoarthritis, left hand: Secondary | ICD-10-CM | POA: Diagnosis not present

## 2021-07-20 DIAGNOSIS — S52572D Other intraarticular fracture of lower end of left radius, subsequent encounter for closed fracture with routine healing: Secondary | ICD-10-CM | POA: Diagnosis not present

## 2021-09-07 DIAGNOSIS — U071 COVID-19: Secondary | ICD-10-CM | POA: Diagnosis not present

## 2021-09-07 DIAGNOSIS — Z23 Encounter for immunization: Secondary | ICD-10-CM | POA: Diagnosis not present

## 2021-11-19 DIAGNOSIS — M19042 Primary osteoarthritis, left hand: Secondary | ICD-10-CM | POA: Diagnosis not present

## 2021-11-19 DIAGNOSIS — M19041 Primary osteoarthritis, right hand: Secondary | ICD-10-CM | POA: Diagnosis not present

## 2021-12-20 DIAGNOSIS — M7989 Other specified soft tissue disorders: Secondary | ICD-10-CM | POA: Diagnosis not present

## 2021-12-20 DIAGNOSIS — M79605 Pain in left leg: Secondary | ICD-10-CM | POA: Diagnosis not present

## 2021-12-25 DIAGNOSIS — M79605 Pain in left leg: Secondary | ICD-10-CM | POA: Diagnosis not present

## 2021-12-25 DIAGNOSIS — T24002S Burn of unspecified degree of unspecified site of left lower limb, except ankle and foot, sequela: Secondary | ICD-10-CM | POA: Diagnosis not present

## 2021-12-25 DIAGNOSIS — L089 Local infection of the skin and subcutaneous tissue, unspecified: Secondary | ICD-10-CM | POA: Diagnosis not present

## 2021-12-25 DIAGNOSIS — M7989 Other specified soft tissue disorders: Secondary | ICD-10-CM | POA: Diagnosis not present

## 2021-12-27 DIAGNOSIS — R609 Edema, unspecified: Secondary | ICD-10-CM | POA: Diagnosis not present

## 2021-12-27 DIAGNOSIS — M1712 Unilateral primary osteoarthritis, left knee: Secondary | ICD-10-CM | POA: Diagnosis not present

## 2021-12-27 DIAGNOSIS — M7989 Other specified soft tissue disorders: Secondary | ICD-10-CM | POA: Diagnosis not present

## 2021-12-27 DIAGNOSIS — M79605 Pain in left leg: Secondary | ICD-10-CM | POA: Diagnosis not present

## 2021-12-27 DIAGNOSIS — M170 Bilateral primary osteoarthritis of knee: Secondary | ICD-10-CM | POA: Diagnosis not present

## 2021-12-27 DIAGNOSIS — I8392 Asymptomatic varicose veins of left lower extremity: Secondary | ICD-10-CM | POA: Diagnosis not present

## 2021-12-31 DIAGNOSIS — M79605 Pain in left leg: Secondary | ICD-10-CM | POA: Diagnosis not present

## 2021-12-31 DIAGNOSIS — M7989 Other specified soft tissue disorders: Secondary | ICD-10-CM | POA: Diagnosis not present

## 2021-12-31 DIAGNOSIS — M7042 Prepatellar bursitis, left knee: Secondary | ICD-10-CM | POA: Diagnosis not present

## 2021-12-31 DIAGNOSIS — L089 Local infection of the skin and subcutaneous tissue, unspecified: Secondary | ICD-10-CM | POA: Diagnosis not present

## 2022-01-01 DIAGNOSIS — M7042 Prepatellar bursitis, left knee: Secondary | ICD-10-CM | POA: Diagnosis not present

## 2022-01-01 DIAGNOSIS — L089 Local infection of the skin and subcutaneous tissue, unspecified: Secondary | ICD-10-CM | POA: Diagnosis not present

## 2022-01-02 DIAGNOSIS — R300 Dysuria: Secondary | ICD-10-CM | POA: Diagnosis not present

## 2022-01-02 DIAGNOSIS — M7989 Other specified soft tissue disorders: Secondary | ICD-10-CM | POA: Diagnosis not present

## 2022-01-02 DIAGNOSIS — M7042 Prepatellar bursitis, left knee: Secondary | ICD-10-CM | POA: Diagnosis not present

## 2022-01-09 DIAGNOSIS — M79605 Pain in left leg: Secondary | ICD-10-CM | POA: Diagnosis not present

## 2022-01-09 DIAGNOSIS — M7989 Other specified soft tissue disorders: Secondary | ICD-10-CM | POA: Diagnosis not present

## 2022-01-09 DIAGNOSIS — L089 Local infection of the skin and subcutaneous tissue, unspecified: Secondary | ICD-10-CM | POA: Diagnosis not present

## 2022-01-09 DIAGNOSIS — M7042 Prepatellar bursitis, left knee: Secondary | ICD-10-CM | POA: Diagnosis not present

## 2022-01-16 DIAGNOSIS — M25569 Pain in unspecified knee: Secondary | ICD-10-CM | POA: Diagnosis not present

## 2022-01-16 DIAGNOSIS — R350 Frequency of micturition: Secondary | ICD-10-CM | POA: Diagnosis not present

## 2022-01-16 DIAGNOSIS — R300 Dysuria: Secondary | ICD-10-CM | POA: Diagnosis not present

## 2022-01-16 DIAGNOSIS — R399 Unspecified symptoms and signs involving the genitourinary system: Secondary | ICD-10-CM | POA: Diagnosis not present

## 2022-01-22 DIAGNOSIS — U071 COVID-19: Secondary | ICD-10-CM | POA: Diagnosis not present

## 2022-01-24 DIAGNOSIS — M7042 Prepatellar bursitis, left knee: Secondary | ICD-10-CM | POA: Diagnosis not present

## 2022-01-24 DIAGNOSIS — B079 Viral wart, unspecified: Secondary | ICD-10-CM | POA: Diagnosis not present

## 2022-01-24 DIAGNOSIS — Z20822 Contact with and (suspected) exposure to covid-19: Secondary | ICD-10-CM | POA: Diagnosis not present

## 2022-01-24 DIAGNOSIS — L089 Local infection of the skin and subcutaneous tissue, unspecified: Secondary | ICD-10-CM | POA: Diagnosis not present

## 2022-01-29 DIAGNOSIS — N3281 Overactive bladder: Secondary | ICD-10-CM | POA: Diagnosis not present

## 2022-01-29 DIAGNOSIS — N400 Enlarged prostate without lower urinary tract symptoms: Secondary | ICD-10-CM | POA: Diagnosis not present

## 2022-01-29 DIAGNOSIS — M7989 Other specified soft tissue disorders: Secondary | ICD-10-CM | POA: Diagnosis not present

## 2022-02-01 DIAGNOSIS — M7989 Other specified soft tissue disorders: Secondary | ICD-10-CM | POA: Diagnosis not present

## 2022-02-08 DIAGNOSIS — I8392 Asymptomatic varicose veins of left lower extremity: Secondary | ICD-10-CM | POA: Diagnosis not present

## 2022-02-15 DIAGNOSIS — R3914 Feeling of incomplete bladder emptying: Secondary | ICD-10-CM | POA: Diagnosis not present

## 2022-02-15 DIAGNOSIS — N401 Enlarged prostate with lower urinary tract symptoms: Secondary | ICD-10-CM | POA: Diagnosis not present

## 2022-03-25 DIAGNOSIS — M180 Bilateral primary osteoarthritis of first carpometacarpal joints: Secondary | ICD-10-CM | POA: Diagnosis not present

## 2022-03-29 DIAGNOSIS — N401 Enlarged prostate with lower urinary tract symptoms: Secondary | ICD-10-CM | POA: Diagnosis not present

## 2022-03-29 DIAGNOSIS — R3914 Feeling of incomplete bladder emptying: Secondary | ICD-10-CM | POA: Diagnosis not present

## 2022-04-01 DIAGNOSIS — Z20822 Contact with and (suspected) exposure to covid-19: Secondary | ICD-10-CM | POA: Diagnosis not present

## 2022-04-09 DIAGNOSIS — M19042 Primary osteoarthritis, left hand: Secondary | ICD-10-CM | POA: Diagnosis not present

## 2022-04-09 DIAGNOSIS — M19041 Primary osteoarthritis, right hand: Secondary | ICD-10-CM | POA: Diagnosis not present

## 2022-05-24 DIAGNOSIS — M19041 Primary osteoarthritis, right hand: Secondary | ICD-10-CM | POA: Diagnosis not present

## 2022-05-24 DIAGNOSIS — M79644 Pain in right finger(s): Secondary | ICD-10-CM | POA: Diagnosis not present

## 2022-05-24 DIAGNOSIS — M659 Synovitis and tenosynovitis, unspecified: Secondary | ICD-10-CM | POA: Diagnosis not present

## 2022-06-03 DIAGNOSIS — M1811 Unilateral primary osteoarthritis of first carpometacarpal joint, right hand: Secondary | ICD-10-CM | POA: Diagnosis not present

## 2022-06-03 DIAGNOSIS — M189 Osteoarthritis of first carpometacarpal joint, unspecified: Secondary | ICD-10-CM | POA: Diagnosis not present

## 2022-06-03 DIAGNOSIS — M7989 Other specified soft tissue disorders: Secondary | ICD-10-CM | POA: Diagnosis not present

## 2022-06-03 DIAGNOSIS — R600 Localized edema: Secondary | ICD-10-CM | POA: Diagnosis not present

## 2022-06-03 DIAGNOSIS — M659 Synovitis and tenosynovitis, unspecified: Secondary | ICD-10-CM | POA: Diagnosis not present

## 2022-06-03 DIAGNOSIS — M19031 Primary osteoarthritis, right wrist: Secondary | ICD-10-CM | POA: Diagnosis not present

## 2022-06-03 DIAGNOSIS — M19041 Primary osteoarthritis, right hand: Secondary | ICD-10-CM | POA: Diagnosis not present

## 2022-06-07 DIAGNOSIS — G720 Drug-induced myopathy: Secondary | ICD-10-CM | POA: Diagnosis not present

## 2022-06-07 DIAGNOSIS — I2510 Atherosclerotic heart disease of native coronary artery without angina pectoris: Secondary | ICD-10-CM | POA: Diagnosis not present

## 2022-06-07 DIAGNOSIS — E538 Deficiency of other specified B group vitamins: Secondary | ICD-10-CM | POA: Diagnosis not present

## 2022-06-07 DIAGNOSIS — Z0000 Encounter for general adult medical examination without abnormal findings: Secondary | ICD-10-CM | POA: Diagnosis not present

## 2022-06-07 DIAGNOSIS — T466X5A Adverse effect of antihyperlipidemic and antiarteriosclerotic drugs, initial encounter: Secondary | ICD-10-CM | POA: Diagnosis not present

## 2022-06-07 DIAGNOSIS — Z1331 Encounter for screening for depression: Secondary | ICD-10-CM | POA: Diagnosis not present

## 2022-06-07 DIAGNOSIS — I10 Essential (primary) hypertension: Secondary | ICD-10-CM | POA: Diagnosis not present

## 2022-06-07 DIAGNOSIS — N400 Enlarged prostate without lower urinary tract symptoms: Secondary | ICD-10-CM | POA: Diagnosis not present

## 2022-06-07 DIAGNOSIS — E7800 Pure hypercholesterolemia, unspecified: Secondary | ICD-10-CM | POA: Diagnosis not present

## 2022-06-12 DIAGNOSIS — M659 Synovitis and tenosynovitis, unspecified: Secondary | ICD-10-CM | POA: Diagnosis not present

## 2022-06-12 DIAGNOSIS — M19041 Primary osteoarthritis, right hand: Secondary | ICD-10-CM | POA: Diagnosis not present

## 2022-06-12 DIAGNOSIS — M79644 Pain in right finger(s): Secondary | ICD-10-CM | POA: Diagnosis not present

## 2022-06-21 DIAGNOSIS — M19041 Primary osteoarthritis, right hand: Secondary | ICD-10-CM | POA: Diagnosis not present

## 2022-06-21 DIAGNOSIS — M1811 Unilateral primary osteoarthritis of first carpometacarpal joint, right hand: Secondary | ICD-10-CM | POA: Diagnosis not present

## 2022-06-21 DIAGNOSIS — M659 Synovitis and tenosynovitis, unspecified: Secondary | ICD-10-CM | POA: Diagnosis not present

## 2022-06-21 DIAGNOSIS — M79641 Pain in right hand: Secondary | ICD-10-CM | POA: Diagnosis not present

## 2022-06-21 DIAGNOSIS — M85841 Other specified disorders of bone density and structure, right hand: Secondary | ICD-10-CM | POA: Diagnosis not present

## 2022-06-26 DIAGNOSIS — M19041 Primary osteoarthritis, right hand: Secondary | ICD-10-CM | POA: Diagnosis not present

## 2022-06-26 DIAGNOSIS — M79644 Pain in right finger(s): Secondary | ICD-10-CM | POA: Diagnosis not present

## 2022-06-27 DIAGNOSIS — R351 Nocturia: Secondary | ICD-10-CM | POA: Diagnosis not present

## 2022-06-27 DIAGNOSIS — R3914 Feeling of incomplete bladder emptying: Secondary | ICD-10-CM | POA: Diagnosis not present

## 2022-06-27 DIAGNOSIS — N401 Enlarged prostate with lower urinary tract symptoms: Secondary | ICD-10-CM | POA: Diagnosis not present

## 2022-07-23 DIAGNOSIS — M659 Synovitis and tenosynovitis, unspecified: Secondary | ICD-10-CM | POA: Diagnosis not present

## 2022-07-23 DIAGNOSIS — M19041 Primary osteoarthritis, right hand: Secondary | ICD-10-CM | POA: Diagnosis not present

## 2022-07-23 DIAGNOSIS — M79642 Pain in left hand: Secondary | ICD-10-CM | POA: Diagnosis not present

## 2022-07-23 DIAGNOSIS — M19042 Primary osteoarthritis, left hand: Secondary | ICD-10-CM | POA: Diagnosis not present

## 2022-08-13 DIAGNOSIS — M19042 Primary osteoarthritis, left hand: Secondary | ICD-10-CM | POA: Diagnosis not present

## 2022-08-28 DIAGNOSIS — Z23 Encounter for immunization: Secondary | ICD-10-CM | POA: Diagnosis not present

## 2022-10-22 DIAGNOSIS — Z23 Encounter for immunization: Secondary | ICD-10-CM | POA: Diagnosis not present

## 2022-11-21 DIAGNOSIS — M25521 Pain in right elbow: Secondary | ICD-10-CM | POA: Diagnosis not present

## 2022-11-21 DIAGNOSIS — S76111A Strain of right quadriceps muscle, fascia and tendon, initial encounter: Secondary | ICD-10-CM | POA: Diagnosis not present

## 2022-11-21 DIAGNOSIS — M19042 Primary osteoarthritis, left hand: Secondary | ICD-10-CM | POA: Diagnosis not present

## 2022-11-21 DIAGNOSIS — M19041 Primary osteoarthritis, right hand: Secondary | ICD-10-CM | POA: Diagnosis not present

## 2022-11-29 DIAGNOSIS — M79651 Pain in right thigh: Secondary | ICD-10-CM | POA: Diagnosis not present

## 2022-11-29 DIAGNOSIS — M7711 Lateral epicondylitis, right elbow: Secondary | ICD-10-CM | POA: Diagnosis not present

## 2022-11-29 DIAGNOSIS — M1811 Unilateral primary osteoarthritis of first carpometacarpal joint, right hand: Secondary | ICD-10-CM | POA: Diagnosis not present

## 2022-12-16 DIAGNOSIS — I10 Essential (primary) hypertension: Secondary | ICD-10-CM | POA: Diagnosis not present

## 2022-12-16 DIAGNOSIS — N401 Enlarged prostate with lower urinary tract symptoms: Secondary | ICD-10-CM | POA: Diagnosis not present

## 2022-12-16 DIAGNOSIS — N400 Enlarged prostate without lower urinary tract symptoms: Secondary | ICD-10-CM | POA: Diagnosis not present

## 2022-12-16 DIAGNOSIS — I2510 Atherosclerotic heart disease of native coronary artery without angina pectoris: Secondary | ICD-10-CM | POA: Diagnosis not present

## 2022-12-16 DIAGNOSIS — D649 Anemia, unspecified: Secondary | ICD-10-CM | POA: Diagnosis not present

## 2022-12-16 DIAGNOSIS — R3914 Feeling of incomplete bladder emptying: Secondary | ICD-10-CM | POA: Diagnosis not present

## 2022-12-16 DIAGNOSIS — E538 Deficiency of other specified B group vitamins: Secondary | ICD-10-CM | POA: Diagnosis not present

## 2022-12-16 DIAGNOSIS — R351 Nocturia: Secondary | ICD-10-CM | POA: Diagnosis not present

## 2022-12-16 DIAGNOSIS — E7800 Pure hypercholesterolemia, unspecified: Secondary | ICD-10-CM | POA: Diagnosis not present

## 2022-12-16 DIAGNOSIS — K911 Postgastric surgery syndromes: Secondary | ICD-10-CM | POA: Diagnosis not present

## 2022-12-19 DIAGNOSIS — D649 Anemia, unspecified: Secondary | ICD-10-CM | POA: Diagnosis not present

## 2022-12-19 DIAGNOSIS — R600 Localized edema: Secondary | ICD-10-CM | POA: Diagnosis not present

## 2022-12-19 DIAGNOSIS — I10 Essential (primary) hypertension: Secondary | ICD-10-CM | POA: Diagnosis not present

## 2022-12-25 DIAGNOSIS — S46211S Strain of muscle, fascia and tendon of other parts of biceps, right arm, sequela: Secondary | ICD-10-CM | POA: Diagnosis not present

## 2022-12-25 DIAGNOSIS — M79605 Pain in left leg: Secondary | ICD-10-CM | POA: Diagnosis not present

## 2022-12-25 DIAGNOSIS — M7711 Lateral epicondylitis, right elbow: Secondary | ICD-10-CM | POA: Diagnosis not present

## 2022-12-25 DIAGNOSIS — M1811 Unilateral primary osteoarthritis of first carpometacarpal joint, right hand: Secondary | ICD-10-CM | POA: Diagnosis not present

## 2022-12-25 DIAGNOSIS — M5410 Radiculopathy, site unspecified: Secondary | ICD-10-CM | POA: Diagnosis not present

## 2022-12-25 DIAGNOSIS — M79604 Pain in right leg: Secondary | ICD-10-CM | POA: Diagnosis not present

## 2023-01-08 DIAGNOSIS — M5410 Radiculopathy, site unspecified: Secondary | ICD-10-CM | POA: Diagnosis not present

## 2023-01-08 DIAGNOSIS — M1811 Unilateral primary osteoarthritis of first carpometacarpal joint, right hand: Secondary | ICD-10-CM | POA: Diagnosis not present

## 2023-01-08 DIAGNOSIS — M5137 Other intervertebral disc degeneration, lumbosacral region: Secondary | ICD-10-CM | POA: Diagnosis not present

## 2023-01-08 DIAGNOSIS — M7711 Lateral epicondylitis, right elbow: Secondary | ICD-10-CM | POA: Diagnosis not present

## 2023-01-08 DIAGNOSIS — S46211S Strain of muscle, fascia and tendon of other parts of biceps, right arm, sequela: Secondary | ICD-10-CM | POA: Diagnosis not present

## 2023-01-14 DIAGNOSIS — I10 Essential (primary) hypertension: Secondary | ICD-10-CM | POA: Diagnosis not present

## 2023-01-23 DIAGNOSIS — M5416 Radiculopathy, lumbar region: Secondary | ICD-10-CM | POA: Diagnosis not present

## 2023-01-30 DIAGNOSIS — M545 Low back pain, unspecified: Secondary | ICD-10-CM | POA: Diagnosis not present

## 2023-01-30 DIAGNOSIS — M5416 Radiculopathy, lumbar region: Secondary | ICD-10-CM | POA: Diagnosis not present

## 2023-02-04 DIAGNOSIS — M5416 Radiculopathy, lumbar region: Secondary | ICD-10-CM | POA: Diagnosis not present

## 2023-02-13 DIAGNOSIS — M5416 Radiculopathy, lumbar region: Secondary | ICD-10-CM | POA: Diagnosis not present

## 2023-03-03 DIAGNOSIS — M5416 Radiculopathy, lumbar region: Secondary | ICD-10-CM | POA: Diagnosis not present

## 2023-03-13 DIAGNOSIS — M5416 Radiculopathy, lumbar region: Secondary | ICD-10-CM | POA: Diagnosis not present

## 2023-03-24 DIAGNOSIS — M19042 Primary osteoarthritis, left hand: Secondary | ICD-10-CM | POA: Diagnosis not present

## 2023-03-24 DIAGNOSIS — M79642 Pain in left hand: Secondary | ICD-10-CM | POA: Diagnosis not present

## 2023-03-24 DIAGNOSIS — M1811 Unilateral primary osteoarthritis of first carpometacarpal joint, right hand: Secondary | ICD-10-CM | POA: Diagnosis not present

## 2023-03-24 DIAGNOSIS — M79641 Pain in right hand: Secondary | ICD-10-CM | POA: Diagnosis not present

## 2023-04-03 DIAGNOSIS — Z6827 Body mass index (BMI) 27.0-27.9, adult: Secondary | ICD-10-CM | POA: Diagnosis not present

## 2023-04-03 DIAGNOSIS — M5416 Radiculopathy, lumbar region: Secondary | ICD-10-CM | POA: Diagnosis not present

## 2023-04-21 DIAGNOSIS — M5416 Radiculopathy, lumbar region: Secondary | ICD-10-CM | POA: Diagnosis not present

## 2023-04-21 DIAGNOSIS — M48062 Spinal stenosis, lumbar region with neurogenic claudication: Secondary | ICD-10-CM | POA: Diagnosis not present

## 2023-04-21 DIAGNOSIS — G9741 Accidental puncture or laceration of dura during a procedure: Secondary | ICD-10-CM | POA: Diagnosis not present

## 2023-04-21 DIAGNOSIS — Z981 Arthrodesis status: Secondary | ICD-10-CM | POA: Diagnosis not present

## 2023-06-18 DIAGNOSIS — R3914 Feeling of incomplete bladder emptying: Secondary | ICD-10-CM | POA: Diagnosis not present

## 2023-06-18 DIAGNOSIS — N401 Enlarged prostate with lower urinary tract symptoms: Secondary | ICD-10-CM | POA: Diagnosis not present

## 2023-06-18 DIAGNOSIS — R351 Nocturia: Secondary | ICD-10-CM | POA: Diagnosis not present

## 2023-06-24 DIAGNOSIS — Z23 Encounter for immunization: Secondary | ICD-10-CM | POA: Diagnosis not present

## 2023-07-07 DIAGNOSIS — G8929 Other chronic pain: Secondary | ICD-10-CM | POA: Diagnosis not present

## 2023-07-07 DIAGNOSIS — Z0000 Encounter for general adult medical examination without abnormal findings: Secondary | ICD-10-CM | POA: Diagnosis not present

## 2023-07-07 DIAGNOSIS — Z79899 Other long term (current) drug therapy: Secondary | ICD-10-CM | POA: Diagnosis not present

## 2023-07-07 DIAGNOSIS — T466X5A Adverse effect of antihyperlipidemic and antiarteriosclerotic drugs, initial encounter: Secondary | ICD-10-CM | POA: Diagnosis not present

## 2023-07-07 DIAGNOSIS — Z1331 Encounter for screening for depression: Secondary | ICD-10-CM | POA: Diagnosis not present

## 2023-07-07 DIAGNOSIS — R42 Dizziness and giddiness: Secondary | ICD-10-CM | POA: Diagnosis not present

## 2023-07-07 DIAGNOSIS — R519 Headache, unspecified: Secondary | ICD-10-CM | POA: Diagnosis not present

## 2023-07-07 DIAGNOSIS — I2510 Atherosclerotic heart disease of native coronary artery without angina pectoris: Secondary | ICD-10-CM | POA: Diagnosis not present

## 2023-07-07 DIAGNOSIS — E538 Deficiency of other specified B group vitamins: Secondary | ICD-10-CM | POA: Diagnosis not present

## 2023-07-07 DIAGNOSIS — D649 Anemia, unspecified: Secondary | ICD-10-CM | POA: Diagnosis not present

## 2023-07-07 DIAGNOSIS — N400 Enlarged prostate without lower urinary tract symptoms: Secondary | ICD-10-CM | POA: Diagnosis not present

## 2023-07-07 DIAGNOSIS — I10 Essential (primary) hypertension: Secondary | ICD-10-CM | POA: Diagnosis not present

## 2023-07-07 DIAGNOSIS — E7800 Pure hypercholesterolemia, unspecified: Secondary | ICD-10-CM | POA: Diagnosis not present

## 2023-07-07 DIAGNOSIS — K911 Postgastric surgery syndromes: Secondary | ICD-10-CM | POA: Diagnosis not present

## 2023-07-14 DIAGNOSIS — D649 Anemia, unspecified: Secondary | ICD-10-CM | POA: Diagnosis not present

## 2023-07-21 DIAGNOSIS — M1811 Unilateral primary osteoarthritis of first carpometacarpal joint, right hand: Secondary | ICD-10-CM | POA: Diagnosis not present

## 2023-07-21 DIAGNOSIS — M19042 Primary osteoarthritis, left hand: Secondary | ICD-10-CM | POA: Diagnosis not present

## 2023-08-04 DIAGNOSIS — M1811 Unilateral primary osteoarthritis of first carpometacarpal joint, right hand: Secondary | ICD-10-CM | POA: Diagnosis not present

## 2023-08-14 DIAGNOSIS — Z23 Encounter for immunization: Secondary | ICD-10-CM | POA: Diagnosis not present

## 2023-10-06 DIAGNOSIS — I10 Essential (primary) hypertension: Secondary | ICD-10-CM | POA: Diagnosis not present

## 2023-10-06 DIAGNOSIS — D649 Anemia, unspecified: Secondary | ICD-10-CM | POA: Diagnosis not present

## 2023-10-06 DIAGNOSIS — H8110 Benign paroxysmal vertigo, unspecified ear: Secondary | ICD-10-CM | POA: Diagnosis not present

## 2023-10-06 DIAGNOSIS — R42 Dizziness and giddiness: Secondary | ICD-10-CM | POA: Diagnosis not present

## 2023-10-06 DIAGNOSIS — I2510 Atherosclerotic heart disease of native coronary artery without angina pectoris: Secondary | ICD-10-CM | POA: Diagnosis not present

## 2023-10-23 ENCOUNTER — Ambulatory Visit: Payer: Medicare Other | Attending: Internal Medicine | Admitting: Physical Therapy

## 2023-10-23 ENCOUNTER — Other Ambulatory Visit: Payer: Self-pay

## 2023-10-23 ENCOUNTER — Encounter: Payer: Self-pay | Admitting: Physical Therapy

## 2023-10-23 DIAGNOSIS — R42 Dizziness and giddiness: Secondary | ICD-10-CM | POA: Diagnosis not present

## 2023-10-23 DIAGNOSIS — R2681 Unsteadiness on feet: Secondary | ICD-10-CM | POA: Insufficient documentation

## 2023-10-23 NOTE — Therapy (Signed)
OUTPATIENT PHYSICAL THERAPY VESTIBULAR EVALUATION     Patient Name: Carlos Navarro MRN: [id number] DOB:[birth year], 87 y.o., male Today's Date: 10/23/2023  END OF SESSION:  PT End of Session - 10/23/23 0849     Visit Number 1    Number of Visits 5    Date for PT Re-Evaluation 11/21/23    Authorization Type Medicare; A[employer]    PT Start Time [phone]    PT Stop Time 0950    PT Time Calculation (min) 60 min    Activity Tolerance Patient tolerated treatment well    Behavior During Therapy [facility] for tasks assessed/performed             Past Medical History:  Diagnosis Date   Adenomatous colon polyp    ASHD (arteriosclerotic heart disease)    S/P DMI in oct 1999 w/ stent placed in the RCA   BPH (benign prostatic hyperplasia)    Cervical disc disease    Dermatitis    on hands   Diverticulosis    DJD (degenerative joint disease)    knees   Dumping syndrome    secondary   ED (erectile dysfunction)    Epididymitis    on the left   H/O vagotomy    HTN (hypertension)    Hypercholesterolemia    Kidney stone on right side    Low back pain    Odynophagia    chronic w/ chest burning   Peripheral edema    Tinnitus    Ventral hernia    Past Surgical History:  Procedure Laterality Date   ANTRECTOMY     APPENDECTOMY     BACK SURGERY     Billroth I anastomosis     CERVICAL DISC SURGERY     x 2   CHOLECYSTECTOMY     COLONOSCOPY WITH PROPOFOL N/A 02/21/2015   Procedure: COLONOSCOPY WITH PROPOFOL;  Surgeon: Charolett Bumpers, MD;  Location: [facility];  Service: Endoscopy;  Laterality: N/A;   HEMORRHOID SURGERY     repair of spinal column  2009   punctured spinal cord and leaking spinal fluid-had to repair   TONSILLECTOMY     Patient Active Problem List   Diagnosis Date Noted   DEGENERATIVE DISC DISEASE 11/29/2008   SPONDYLOSIS, LUMBAR, WITH RADICULOPATHY 11/29/2008   DYSURIA 11/29/2008   OTHER POSTOPERATIVE INFECTION NEC 11/29/2008   OTHER POSTSURGICAL STATUS OTHER  11/29/2008    PCP: Kirby Funk, MD REFERRING PROVIDER: Emilio Aspen, MD   REFERRING DIAG: H81.10 (ICD-10-CM) - Benign paroxysmal vertigo, unspecified ear   THERAPY DIAG:  Dizziness and giddiness  Unsteadiness on feet  ONSET DATE: 10/07/2023 (MD referral)  Rationale for Evaluation and Treatment: Rehabilitation  SUBJECTIVE:   SUBJECTIVE STATEMENT: I get up quickly from bed and my brain "keeps going" and I stagger.  Have a hard time climbing ladder because I get dizzy.  If I turn a corner or turn my head too quickly, then I get dizzy.  Saw Dr. Orson Aloe and he moved my head and the spinning was so bad. Pt accompanied by: self  PERTINENT HISTORY: HTN, possible component of orthostatic hypotension, BLE edema, HLD, back pain, laminectomy L4-L5; then L3-4 laminectomy 04/2023  PAIN:  Are you having pain? No  PRECAUTIONS: Fall  RED FLAGS: None   WEIGHT BEARING RESTRICTIONS: No  FALLS: Has patient fallen in last 6 months? No  LIVING ENVIRONMENT: Lives with: lives with their family and lives alone Lives in: House/apartment   PLOF: Independent  PATIENT GOALS: To  figure out about the dizziness and get rid of it.  OBJECTIVE:  Note: Objective measures were completed at Evaluation unless otherwise noted.  DIAGNOSTIC FINDINGS: NA  COGNITION: Overall cognitive status: Within functional limits for tasks assessed    Cervical ROM:  limited in rotation L >R    Some pain with flexion   BED MOBILITY:  independent  TRANSFERS: Assistive device utilized: None  Sit to stand: Modified independence Stand to sit: Modified independence  GAIT: Gait pattern: WFL   PATIENT SURVEYS:  FOTO Intake 58; predicted 61  VESTIBULAR ASSESSMENT:  GENERAL OBSERVATION: No acute distress   SYMPTOM BEHAVIOR:  Subjective history: Saw Dr. Orson Aloe 10/06/23.  He tested for dizziness and with the positional testing, the whole world was spinning.  He gave me Epley maneuver papers  and I did those until 10/17/23 and I haven't had symptoms since then.  The dizziness overall has been going on for several months.  I was concerned that the medications I was on (BP meds) were causing dizziness.  He did give me Meclizine and I'm taking it every 12 hours  Non-Vestibular symptoms:  NA  Type of dizziness: Imbalance (Disequilibrium), Spinning/Vertigo, and Unsteady with head/body turns  Frequency: would occur multiple times daily (less frequent now)  Duration: 30-60 sec  Aggravating factors: Induced by position change: supine to sit and sit to stand and Induced by motion: turning body quickly and turning head quickly  Relieving factors: head stationary and slow movements  Progression of symptoms: better  OCULOMOTOR EXAM:  Ocular Alignment: normal  Ocular ROM: No Limitations  Spontaneous Nystagmus: absent  Gaze-Induced Nystagmus: absent  Smooth Pursuits: intact  Saccades: intact  Convergence/Divergence: 2 cm   VESTIBULAR - OCULAR REFLEX:   Slow VOR: Normal  VOR Cancellation: Normal  Head-Impulse Test: HIT Right: difficult to relax HIT Left: difficult ot relax  Dynamic Visual Acuity:  NT   POSITIONAL TESTING: Right Dix-Hallpike: no nystagmus Left Dix-Hallpike: no nystagmus Right Roll Test: no nystagmus Left Roll Test: no nystagmus     FUNCTIONAL GAIT:   M-CTSIB  Condition 1: Firm Surface, EO 30 Sec, Normal Sway  Condition 2: Firm Surface, EC 30 Sec, Mild Sway  Condition 3: Foam Surface, EO 30 Sec, Moderate Sway  Condition 4: Foam Surface, EC 14.22 Sec, Severe Sway     VESTIBULAR TREATMENT:                                                                                                   DATE: 10/23/2023     PATIENT EDUCATION: Education details: Eval results, POC, vestibular system use for balance and initial HEP; encouraged pt to ask MD about continued use of Meclizine if BPPV has resolved Person educated: Patient Education method: Explanation, Demonstration,  Verbal cues, and Handouts Education comprehension: verbalized understanding, returned demonstration, and needs further education  HOME EXERCISE PROGRAM: Access Code: [email or web address] URL: [email or web address] Date: 10/23/2023 Prepared by: [facility] - Outpatient  Rehab - [facility]  Exercises - Romberg Stance  - 1-2 x daily - 7 x weekly - 1 sets - 5-10 reps - Romberg Stance  with Eyes Closed  - 1-2 x daily - 7 x weekly - 1 sets - 5-10 reps GOALS: Goals reviewed with patient? Yes  SHORT TERM GOALS:= LTGs   LONG TERM GOALS: Target date: 11/21/2023  Pt will be independent with HEP for improved dizziness and balance. Baseline:  Goal status: INITIAL  2.  MCTSIB Condition 4 to improve to 30 seconds moderate or less sway to indicate improved vestibular system use for balance. Baseline:  Goal status: INITIAL  3.  Pt will improve FOTO score to at least 61 to demonstrate improved overall functional mobility. Baseline: 58 at eval Goal status: INITIAL   ASSESSMENT:  CLINICAL IMPRESSION: Patient is a 87 y.o. male who was seen today for physical therapy evaluation and treatment for BPPV.   He reports having at least 3-6 month history of dizziness with quick head and body movements or when being on a ladder (also described as lightheadedness or imbalance).  He also reports having seen Dr. Orson Aloe for vertigo symptoms, as he was having spinning with rolling and upon getting up out of bed.  He was given home manuevers for Epley, which pt reports doing until vertigo cleared.  Today, at vestibular evaluation, oculomotor testing and VOR testing is WNL.  With positional testing for BPPV, pt is negative for R and L Dix Hallpike and negative for R and L roll test, so no positional vertigo present today.  With balance testing (MCTSIB), pt has increased sway with Conditions 3 and 4, and on Condition 4, he is unable to hold >14 sec.  This demonstrates decreased vestibular system use for  balance.  Pt would benefit from skilled PT towards to address vestibular system use for balance to decrease pt's sense of dizziness and imbalance, for optimal participation in daily activities and functional mobility.  OBJECTIVE IMPAIRMENTS: decreased balance and dizziness.   ACTIVITY LIMITATIONS: bending, standing, and reach over head  PARTICIPATION LIMITATIONS: yard work and housework/ladders  PERSONAL FACTORS: 3+ comorbidities: see above  are also affecting patient's functional outcome.   REHAB POTENTIAL: Good  CLINICAL DECISION MAKING: Stable/uncomplicated  EVALUATION COMPLEXITY: Low   PLAN:  PT FREQUENCY: 1x/week  PT DURATION: 4 weeks plus eval week = 5 wk POC  PLANNED INTERVENTIONS: 97110-Therapeutic exercises, 97530- Therapeutic activity, 97112- Neuromuscular re-education, 97535- Self Care, 40981- Canalith repositioning, Patient/Family education, Balance training, and Vestibular training  PLAN FOR NEXT SESSION: Review HEP and progress for compliant surfaces (if safe to do so at home); reassess for BPPV if needed   Cleopatra Sardo W., PT 10/23/2023, 9:58 AM  [facility] at [facility] [address], [address] [place], [place] [address] Phone # [phone] Fax # [phone]

## 2023-10-28 NOTE — Therapy (Signed)
OUTPATIENT PHYSICAL THERAPY VESTIBULAR TREATMENT     Patient Name: Carlos Navarro MRN: [id number] DOB:September 09, 1936, 87 y.o., male Today's Date: 10/30/2023   Progress Note Reporting Period 10/23/23 to 10/30/23  See note below for Objective Data and Assessment of Progress/Goals.      END OF SESSION:  PT End of Session - 10/30/23 1039     Visit Number 2    Number of Visits 5    Date for PT Re-Evaluation 11/21/23    Authorization Type Medicare; AARP    PT Start Time 1022    PT Stop Time 1037    PT Time Calculation (min) 15 min    Activity Tolerance Patient tolerated treatment well    Behavior During Therapy WFL for tasks assessed/performed              Past Medical History:  Diagnosis Date   Adenomatous colon polyp    ASHD (arteriosclerotic heart disease)    S/P DMI in oct 1999 w/ stent placed in the RCA   BPH (benign prostatic hyperplasia)    Cervical disc disease    Dermatitis    on hands   Diverticulosis    DJD (degenerative joint disease)    knees   Dumping syndrome    secondary   ED (erectile dysfunction)    Epididymitis    on the left   H/O vagotomy    HTN (hypertension)    Hypercholesterolemia    Kidney stone on right side    Low back pain    Odynophagia    chronic w/ chest burning   Peripheral edema    Tinnitus    Ventral hernia    Past Surgical History:  Procedure Laterality Date   ANTRECTOMY     APPENDECTOMY     BACK SURGERY     Billroth I anastomosis     CERVICAL DISC SURGERY     x 2   CHOLECYSTECTOMY     COLONOSCOPY WITH PROPOFOL N/A 02/21/2015   Procedure: COLONOSCOPY WITH PROPOFOL;  Surgeon: Charolett Bumpers, MD;  Location: [facility];  Service: Endoscopy;  Laterality: N/A;   HEMORRHOID SURGERY     repair of spinal column  2009   punctured spinal cord and leaking spinal fluid-had to repair   TONSILLECTOMY     Patient Active Problem List   Diagnosis Date Noted   DEGENERATIVE DISC DISEASE 11/29/2008   SPONDYLOSIS, LUMBAR, WITH  RADICULOPATHY 11/29/2008   DYSURIA 11/29/2008   OTHER POSTOPERATIVE INFECTION NEC 11/29/2008   OTHER POSTSURGICAL STATUS OTHER 11/29/2008    PCP: Kirby Funk, MD REFERRING PROVIDER: Emilio Aspen, MD   REFERRING DIAG: H81.10 (ICD-10-CM) - Benign paroxysmal vertigo, unspecified ear   THERAPY DIAG:  Dizziness and giddiness  Unsteadiness on feet  ONSET DATE: 10/07/2023 (MD referral)  Rationale for Evaluation and Treatment: Rehabilitation  SUBJECTIVE:   SUBJECTIVE STATEMENT: Patient shows handouts of self-epley that he was doing per Dr. Donnamarie Poag request- reports that he has discontinued this as symptoms have resolves. Also reports compliance with HEP.  Feels like dizziness and balance is better. Cannot think of any activities currently that make him dizzy. Requesting to wrap up today as he is feeling better.   Pt accompanied by: self  PERTINENT HISTORY: HTN, possible component of orthostatic hypotension, BLE edema, HLD, back pain, laminectomy L4-L5; then L3-4 laminectomy 04/2023  PAIN:  Are you having pain? No  PRECAUTIONS: Fall  RED FLAGS: None   WEIGHT BEARING RESTRICTIONS: No  FALLS: Has patient fallen in  last 6 months? No  LIVING ENVIRONMENT: Lives with: lives with their family and lives alone Lives in: House/apartment   PLOF: Independent  PATIENT GOALS: To figure out about the dizziness and get rid of it.  OBJECTIVE:      TODAY'S TREATMENT: 10/30/23 Activity Comments  FOTO [phone]  MCTSIB #4 Mild to mod sway for 30 sec; required 2 trials      PATIENT EDUCATION: Education details: edu on 30 day hold or to return with new referral is >30 days  Person educated: Patient Education method: Explanation Education comprehension: verbalized understanding    Note: Objective measures were completed at Evaluation unless otherwise noted.  DIAGNOSTIC FINDINGS: NA  COGNITION: Overall cognitive status: Within functional limits for tasks  assessed    Cervical ROM:  limited in rotation L >R    Some pain with flexion   BED MOBILITY:  independent  TRANSFERS: Assistive device utilized: None  Sit to stand: Modified independence Stand to sit: Modified independence  GAIT: Gait pattern: WFL   PATIENT SURVEYS:  FOTO Intake 58; predicted 61  VESTIBULAR ASSESSMENT:  GENERAL OBSERVATION: No acute distress   SYMPTOM BEHAVIOR:  Subjective history: Saw Dr. Orson Aloe 10/06/23.  He tested for dizziness and with the positional testing, the whole world was spinning.  He gave me Epley maneuver papers and I did those until 10/17/23 and I haven't had symptoms since then.  The dizziness overall has been going on for several months.  I was concerned that the medications I was on (BP meds) were causing dizziness.  He did give me Meclizine and I'm taking it every 12 hours  Non-Vestibular symptoms:  NA  Type of dizziness: Imbalance (Disequilibrium), Spinning/Vertigo, and Unsteady with head/body turns  Frequency: would occur multiple times daily (less frequent now)  Duration: 30-60 sec  Aggravating factors: Induced by position change: supine to sit and sit to stand and Induced by motion: turning body quickly and turning head quickly  Relieving factors: head stationary and slow movements  Progression of symptoms: better  OCULOMOTOR EXAM:  Ocular Alignment: normal  Ocular ROM: No Limitations  Spontaneous Nystagmus: absent  Gaze-Induced Nystagmus: absent  Smooth Pursuits: intact  Saccades: intact  Convergence/Divergence: 2 cm   VESTIBULAR - OCULAR REFLEX:   Slow VOR: Normal  VOR Cancellation: Normal  Head-Impulse Test: HIT Right: difficult to relax HIT Left: difficult ot relax  Dynamic Visual Acuity:  NT   POSITIONAL TESTING: Right Dix-Hallpike: no nystagmus Left Dix-Hallpike: no nystagmus Right Roll Test: no nystagmus Left Roll Test: no nystagmus     FUNCTIONAL GAIT:   M-CTSIB  Condition 1: Firm Surface, EO 30 Sec,  Normal Sway  Condition 2: Firm Surface, EC 30 Sec, Mild Sway  Condition 3: Foam Surface, EO 30 Sec, Moderate Sway  Condition 4: Foam Surface, EC 14.22 Sec, Severe Sway     VESTIBULAR TREATMENT:                                                                                                   DATE: 10/23/2023     PATIENT EDUCATION: Education details: Eval results,  POC, vestibular system use for balance and initial HEP; encouraged pt to ask MD about continued use of Meclizine if BPPV has resolved Person educated: Patient Education method: Explanation, Demonstration, Verbal cues, and Handouts Education comprehension: verbalized understanding, returned demonstration, and needs further education  HOME EXERCISE PROGRAM: Access Code: [email or web address] URL: [email or web address] Date: 10/23/2023 Prepared by: [facility] - Outpatient  Rehab - [facility]  Exercises - Romberg Stance  - 1-2 x daily - 7 x weekly - 1 sets - 5-10 reps - Romberg Stance with Eyes Closed  - 1-2 x daily - 7 x weekly - 1 sets - 5-10 reps GOALS: Goals reviewed with patient? Yes  SHORT TERM GOALS:= LTGs   LONG TERM GOALS: Target date: 11/21/2023  Pt will be independent with HEP for improved dizziness and balance. Baseline:  Goal status: MET 10/30/23  2.  MCTSIB Condition 4 to improve to 30 seconds moderate or less sway to indicate improved vestibular system use for balance. Baseline: mild-mod sway 10/30/23 Goal status: MET 10/30/23  3.  Pt will improve FOTO score to at least 61 to demonstrate improved overall functional mobility. Baseline: 58 at eval; 61 10/30/23 Goal status: MET 10/30/23   ASSESSMENT:  CLINICAL IMPRESSION: Patient arrived to session with report of compliance with his HEP and notes improved dizziness and balance. Patient unable to provide any examples of activities that make him dizzy at this time. Goals were assessed and were all met. Patient is ready for 30 day hold at this  time and reports satisfaction with his experience in vestibular rehab.   OBJECTIVE IMPAIRMENTS: decreased balance and dizziness.   ACTIVITY LIMITATIONS: bending, standing, and reach over head  PARTICIPATION LIMITATIONS: yard work and housework/ladders  PERSONAL FACTORS: 3+ comorbidities: see above  are also affecting patient's functional outcome.   REHAB POTENTIAL: Good  CLINICAL DECISION MAKING: Stable/uncomplicated  EVALUATION COMPLEXITY: Low   PLAN:  PT FREQUENCY: 1x/week  PT DURATION: 4 weeks plus eval week = 5 wk POC  PLANNED INTERVENTIONS: 97110-Therapeutic exercises, 97530- Therapeutic activity, 97112- Neuromuscular re-education, 97535- Self Care, 54098- Canalith repositioning, Patient/Family education, Balance training, and Vestibular training  PLAN FOR NEXT SESSION: 30 day hold at this time     Anette Guarneri, PT, DPT 10/30/23 10:42 AM  [facility] at [facility] [address], [address] [place], [place] [address] Phone # [phone] Fax # [phone]

## 2023-10-30 ENCOUNTER — Ambulatory Visit: Payer: Medicare Other | Admitting: Physical Therapy

## 2023-10-30 ENCOUNTER — Encounter: Payer: Self-pay | Admitting: Physical Therapy

## 2023-10-30 DIAGNOSIS — H8110 Benign paroxysmal vertigo, unspecified ear: Secondary | ICD-10-CM | POA: Diagnosis not present

## 2023-10-30 DIAGNOSIS — D649 Anemia, unspecified: Secondary | ICD-10-CM | POA: Diagnosis not present

## 2023-10-30 DIAGNOSIS — I10 Essential (primary) hypertension: Secondary | ICD-10-CM | POA: Diagnosis not present

## 2023-10-30 DIAGNOSIS — R2681 Unsteadiness on feet: Secondary | ICD-10-CM

## 2023-10-30 DIAGNOSIS — R42 Dizziness and giddiness: Secondary | ICD-10-CM | POA: Diagnosis not present

## 2023-11-19 DIAGNOSIS — M19042 Primary osteoarthritis, left hand: Secondary | ICD-10-CM | POA: Diagnosis not present

## 2023-11-19 DIAGNOSIS — M79642 Pain in left hand: Secondary | ICD-10-CM | POA: Diagnosis not present

## 2023-11-19 DIAGNOSIS — M1811 Unilateral primary osteoarthritis of first carpometacarpal joint, right hand: Secondary | ICD-10-CM | POA: Diagnosis not present

## 2023-11-19 DIAGNOSIS — M79641 Pain in right hand: Secondary | ICD-10-CM | POA: Diagnosis not present

## 2023-12-05 DIAGNOSIS — M1811 Unilateral primary osteoarthritis of first carpometacarpal joint, right hand: Secondary | ICD-10-CM | POA: Diagnosis not present

## 2023-12-05 DIAGNOSIS — M19042 Primary osteoarthritis, left hand: Secondary | ICD-10-CM | POA: Diagnosis not present

## 2024-03-06 ENCOUNTER — Emergency Department (HOSPITAL_COMMUNITY)
Admission: EM | Admit: 2024-03-06 | Discharge: 2024-03-06 | Disposition: A | Discharge status: Home / Self Care | Attending: Emergency Medicine | Admitting: Emergency Medicine

## 2024-03-06 ENCOUNTER — Emergency Department (HOSPITAL_COMMUNITY)

## 2024-03-06 ENCOUNTER — Other Ambulatory Visit: Payer: Self-pay

## 2024-03-06 DIAGNOSIS — S0081XA Abrasion of other part of head, initial encounter: Secondary | ICD-10-CM | POA: Diagnosis not present

## 2024-03-06 DIAGNOSIS — R109 Unspecified abdominal pain: Secondary | ICD-10-CM | POA: Insufficient documentation

## 2024-03-06 DIAGNOSIS — S0990XA Unspecified injury of head, initial encounter: Secondary | ICD-10-CM

## 2024-03-06 DIAGNOSIS — M25532 Pain in left wrist: Secondary | ICD-10-CM | POA: Insufficient documentation

## 2024-03-06 DIAGNOSIS — M79641 Pain in right hand: Secondary | ICD-10-CM | POA: Insufficient documentation

## 2024-03-06 DIAGNOSIS — S8002XA Contusion of left knee, initial encounter: Secondary | ICD-10-CM

## 2024-03-06 DIAGNOSIS — M25562 Pain in left knee: Secondary | ICD-10-CM | POA: Insufficient documentation

## 2024-03-06 DIAGNOSIS — W108XXA Fall (on) (from) other stairs and steps, initial encounter: Secondary | ICD-10-CM | POA: Insufficient documentation

## 2024-03-06 DIAGNOSIS — R079 Chest pain, unspecified: Secondary | ICD-10-CM | POA: Diagnosis not present

## 2024-03-06 DIAGNOSIS — S60512A Abrasion of left hand, initial encounter: Secondary | ICD-10-CM | POA: Diagnosis not present

## 2024-03-06 DIAGNOSIS — S20212A Contusion of left front wall of thorax, initial encounter: Secondary | ICD-10-CM

## 2024-03-06 DIAGNOSIS — S6392XA Sprain of unspecified part of left wrist and hand, initial encounter: Secondary | ICD-10-CM

## 2024-03-06 DIAGNOSIS — S0083XA Contusion of other part of head, initial encounter: Secondary | ICD-10-CM

## 2024-03-06 DIAGNOSIS — Z23 Encounter for immunization: Secondary | ICD-10-CM | POA: Insufficient documentation

## 2024-03-06 DIAGNOSIS — W19XXXA Unspecified fall, initial encounter: Secondary | ICD-10-CM

## 2024-03-06 DIAGNOSIS — S63502A Unspecified sprain of left wrist, initial encounter: Secondary | ICD-10-CM

## 2024-03-06 LAB — CBC WITH DIFFERENTIAL/PLATELET
Abs Immature Granulocytes: 0.01 10*3/uL (ref 0.00–0.07)
Basophils Absolute: 0 10*3/uL (ref 0.0–0.1)
Basophils Relative: 1 %
Eosinophils Absolute: 0.2 10*3/uL (ref 0.0–0.5)
Eosinophils Relative: 3 %
HCT: 34.4 % — ABNORMAL LOW (ref 39.0–52.0)
Hemoglobin: 11.3 g/dL — ABNORMAL LOW (ref 13.0–17.0)
Immature Granulocytes: 0 %
Lymphocytes Relative: 14 %
Lymphs Abs: 0.7 10*3/uL (ref 0.7–4.0)
MCH: 33.3 pg (ref 26.0–34.0)
MCHC: 32.8 g/dL (ref 30.0–36.0)
MCV: 101.5 fL — ABNORMAL HIGH (ref 80.0–100.0)
Monocytes Absolute: 0.3 10*3/uL (ref 0.1–1.0)
Monocytes Relative: 6 %
Neutro Abs: 4 10*3/uL (ref 1.7–7.7)
Neutrophils Relative %: 76 %
Platelets: 166 10*3/uL (ref 150–400)
RBC: 3.39 MIL/uL — ABNORMAL LOW (ref 4.22–5.81)
RDW: 13.5 % (ref 11.5–15.5)
WBC: 5.3 10*3/uL (ref 4.0–10.5)
nRBC: 0 % (ref 0.0–0.2)

## 2024-03-06 LAB — COMPREHENSIVE METABOLIC PANEL WITH GFR
ALT: 32 U/L (ref 0–44)
AST: 44 U/L — ABNORMAL HIGH (ref 15–41)
Albumin: 3.7 g/dL (ref 3.5–5.0)
Alkaline Phosphatase: 62 U/L (ref 38–126)
Anion gap: 8 (ref 5–15)
BUN: 14 mg/dL (ref 8–23)
CO2: 23 mmol/L (ref 22–32)
Calcium: 8.9 mg/dL (ref 8.9–10.3)
Chloride: 107 mmol/L (ref 98–111)
Creatinine, Ser: 0.82 mg/dL (ref 0.61–1.24)
GFR, Estimated: >60 mL/min (ref >60)
Glucose, Bld: 113 mg/dL — ABNORMAL HIGH (ref 70–99)
Potassium: 4 mmol/L (ref 3.5–5.1)
Sodium: 138 mmol/L (ref 135–145)
Total Bilirubin: 0.9 mg/dL (ref 0.0–1.2)
Total Protein: 6.8 g/dL (ref 6.5–8.1)

## 2024-03-06 LAB — TROPONIN I (HIGH SENSITIVITY)
Troponin I (High Sensitivity): 4 ng/L (ref <18)
Troponin I (High Sensitivity): 4 ng/L (ref <18)

## 2024-03-06 LAB — PROTIME-INR
INR: 1 (ref 0.8–1.2)
Prothrombin Time: 13.7 s (ref 11.4–15.2)

## 2024-03-06 MED ORDER — ONDANSETRON HCL 4 MG/2ML IJ SOLN
4.0000 mg | Freq: Once | INTRAMUSCULAR | Status: AC
  Administered 2024-03-06: 4 mg via INTRAVENOUS
  Filled 2024-03-06: qty 2

## 2024-03-06 MED ORDER — PANTOPRAZOLE SODIUM 40 MG IV SOLR
40.0000 mg | Freq: Once | INTRAVENOUS | Status: AC
Start: 2024-03-06 — End: 2024-03-06
  Administered 2024-03-06: 40 mg via INTRAVENOUS
  Filled 2024-03-06: qty 10

## 2024-03-06 MED ORDER — MORPHINE SULFATE (PF) 4 MG/ML IV SOLN
2.0000 mg | Freq: Once | INTRAVENOUS | Status: AC
  Administered 2024-03-06: 2 mg via INTRAVENOUS
  Filled 2024-03-06: qty 1

## 2024-03-06 MED ORDER — IOHEXOL 300 MG/ML  SOLN
100.0000 mL | Freq: Once | INTRAMUSCULAR | Status: AC | PRN
  Administered 2024-03-06: 100 mL via INTRAVENOUS

## 2024-03-06 MED ORDER — TETANUS-DIPHTH-ACELL PERTUSSIS 5-2.5-18.5 LF-MCG/0.5 IM SUSY
0.5000 mL | PREFILLED_SYRINGE | Freq: Once | INTRAMUSCULAR | Status: AC
  Administered 2024-03-06: 0.5 mL via INTRAMUSCULAR
  Filled 2024-03-06: qty 0.5

## 2024-03-06 MED ORDER — BACITRACIN ZINC 500 UNIT/GM EX OINT
TOPICAL_OINTMENT | Freq: Once | CUTANEOUS | Status: AC
  Filled 2024-03-06: qty 4.5

## 2024-03-06 NOTE — ED Notes (Signed)
 C-collar in place

## 2024-03-06 NOTE — Discharge Instructions (Signed)
 Please follow-up closely with your primary care doctor on outpatient basis.  Return to emergency department immediately for any new or worsening symptoms.

## 2024-03-06 NOTE — ED Notes (Signed)
 Patient transported to CT

## 2024-03-06 NOTE — ED Provider Notes (Signed)
 [place] EMERGENCY DEPARTMENT AT [facility] Provider Note   CSN: [id number] Arrival date & time: 03/06/24  1000     History  Chief Complaint  Patient presents with   Carlos Navarro is a 88 y.o. male.  Patient is an 88 year old male who presents to Emergency Department with a chief complaint of a fall down approximately 3 stairs.  Patient notes that he tripped on the top stair falling face forward.  He notes that he did strike his head on the ground and notes that he is experiencing pain to bilateral hands, wrist, left side of the chest and left knee.  Patient denies any use of anticoagulation.  He notes that the fall was mechanical in nature with no preceding dizziness, lightheadedness, syncope, chest pain, shortness of breath, palpitations.  He does admit to some mild abdominal discomfort as well as nausea.  He has had no associated vomiting.  He denies any numbness or paresthesias.  He is still able to move all 4 extremities.   Fall Associated symptoms include chest pain and headaches.       Home Medications Prior to Admission medications   Medication Sig Start Date End Date Taking? Authorizing Provider  acetaminophen (TYLENOL) 500 MG tablet Take 1,500 mg by mouth daily as needed (Pain).     [provider]  amLODipine (NORVASC) 2.5 MG tablet Take 2.5 mg by mouth daily.    [provider]  HYDROcodone-acetaminophen (NORCO/VICODIN) 5-325 MG tablet Take 2 tablets by mouth every 4 (four) hours as needed. 05/18/21   Theron Arista, PA-C  ibuprofen (ADVIL,MOTRIN) 200 MG tablet Take 600 mg by mouth 2 (two) times daily.     [provider]  meclizine (ANTIVERT) 12.5 MG tablet Take 12.5 mg by mouth 2 (two) times daily as needed for dizziness.    [provider]  ramipril (ALTACE) 10 MG capsule Take 10 mg by mouth daily.    [provider]  simvastatin (ZOCOR) 20 MG tablet Take 20 mg by mouth daily.    [provider]   tamsulosin (FLOMAX) 0.4 MG CAPS capsule Take 0.4 mg by mouth daily. Take 30 mins before the same meal every day.    [provider]  vitamin B-12 (CYANOCOBALAMIN) 1000 MCG tablet Take 1,000 mcg by mouth daily.    [provider]      Allergies    Codeine and Pravastatin sodium    Review of Systems   Review of Systems  Cardiovascular:  Positive for chest pain.  Musculoskeletal:        Pain to bilateral hands, wrist and left knee  Skin:        Abrasion to face and left hand  Neurological:  Positive for headaches.    Physical Exam Updated Vital Signs Temp 98 F (36.7 C) (Oral)  Physical Exam Vitals and nursing note reviewed.  Constitutional:      Appearance: Normal appearance.  HENT:     Head: Normocephalic.     Nose: Nose normal.     Mouth/Throat:     Mouth: Mucous membranes are moist.  Eyes:     Extraocular Movements: Extraocular movements intact.     Conjunctiva/sclera: Conjunctivae normal.     Pupils: Pupils are equal, round, and reactive to light.  Neck:     Comments: No step-off or deformity Cardiovascular:     Rate and Rhythm: Normal rate and regular rhythm.     Pulses: Normal pulses.  Heart sounds: Normal heart sounds. No murmur heard.    No gallop.  Pulmonary:     Effort: Pulmonary effort is normal. No respiratory distress.     Breath sounds: Normal breath sounds. No stridor. No wheezing, rhonchi or rales.     Comments: Tenderness palpation over left anterior chest wall Abdominal:     General: Abdomen is flat. Bowel sounds are normal. There is no distension.     Palpations: Abdomen is soft. There is no mass.     Tenderness: There is no guarding.     Hernia: No hernia is present.     Comments: Mild tenderness diffusely, no bruising  Musculoskeletal:     Cervical back: Normal range of motion and neck supple. No tenderness.     Comments: Tenderness palpation noted over bilateral hands and wrist, nontender palpation of bilateral elbows or  shoulders, radial pulse 2+ upper extremities, cap refill less than 2 seconds distally, full range of motion noted throughout bilateral upper extremities, radial, ulnar, median, axillary nerve function intact distally Tenderness palpation noted over left knee diffusely, nontender palpation of remainder of bilateral lower extremities, pelvis stable to AP and lateral compression, DP and PT pulses are 2+ distally, sensation is intact distally, full range of motion noted throughout, no obvious deformities to bilateral upper and lower extremities Nontender palpation of the thoracic and lumbar spine, no step-off or deformity  Skin:    General: Skin is warm and dry.     Comments: Abrasion noted over left forehead, left hand, no lacerations that can be sutured  Neurological:     General: No focal deficit present.     Mental Status: He is alert and oriented to person, place, and time. Mental status is at baseline.  Psychiatric:        Mood and Affect: Mood normal.        Behavior: Behavior normal.        Thought Content: Thought content normal.        Judgment: Judgment normal.     ED Results / Procedures / Treatments   Labs (all labs ordered are listed, but only abnormal results are displayed) Labs Reviewed  COMPREHENSIVE METABOLIC PANEL WITH GFR  CBC WITH DIFFERENTIAL/PLATELET  PROTIME-INR  TROPONIN I (HIGH SENSITIVITY)    EKG None  Radiology No results found.  Procedures Procedures    Medications Ordered in ED Medications  morphine (PF) 4 MG/ML injection 2 mg (has no administration in time range)  ondansetron (ZOFRAN) injection 4 mg (has no administration in time range)    ED Course/ Medical Decision Making/ A&P                                 Medical Decision Making Amount and/or Complexity of Data Reviewed Labs: ordered. Radiology: ordered.  Risk OTC drugs. Prescription drug management.   This patient presents to the ED for concern of fall, facial abrasion, pain to  bilateral hands, pain to the left knee differential diagnosis includes contusion, abrasion, sprain, strain, intracranial hemorrhage, vertebral fracture, rib fracture, intra-abdominal hemorrhage    Additional history obtained:  Additional history obtained from none External records from outside source obtained and reviewed including none   Lab Tests:  I Ordered, and personally interpreted labs.  The pertinent results include: No leukocytosis, mild anemia, normal liver function and kidney function, normal electrolytes, normal serial troponins   Imaging Studies ordered:  I ordered imaging studies including  CT scan of head, maxillofacial, cervical spine, chest, abdomen and pelvis, x-rays of bilateral hands and wrist and left knee I independently visualized and interpreted imaging which showed no acute intracranial hemorrhage, no vertebral fracture, no facial fracture, no acute traumatic injury within the chest, abdomen and pelvis, no acute osseous injury of bilateral hands, wrist, left knee I agree with the radiologist interpretation   Medicines ordered and prescription drug management:  I ordered medication including morphine, Tdap for follow-up with abrasion Reevaluation of the patient after these medicines showed that the patient improved I have reviewed the patients home medicines and have made adjustments as needed   Problem List / ED Course:  Patient is doing well at this time and is stable for discharge home.  All workup in the emergency department has been unremarkable.  All imaging performed demonstrated no signs of acute traumatic injury.  Repeat serial troponins were negative as well and do not suspect ACS at this time.  Blood work is otherwise been unremarkable.  Suspect that the scapholunate abnormality noted on x-ray of the right wrist is chronic as patient has no acute pain over the site.  Will continue symptomatic treatment on outpatient basis.  Close follow-up with primary  care doctor was discussed as well as strict turn precautions for any new or worsening symptoms.  Patient and daughter voiced understanding and had no additional questions.   Social Determinants of Health:  None           Final Clinical Impression(s) / ED Diagnoses Final diagnoses:  None    Rx / DC Orders ED Discharge Orders     None         Kathlen Mody 03/06/24 1430    Gloris Manchester, MD 03/06/24 [phone]

## 2024-03-06 NOTE — ED Notes (Signed)
 Pt able to ambulate around room with standyby assistance. Denies dizziness, gait WNL

## 2024-03-06 NOTE — ED Notes (Signed)
 Abrasions/skin tears on forehead and forearms, as well as hands, flushed and cleaned out with NaCl and bacitracin ointment applied to those areas. Areas bandaged as well

## 2024-03-06 NOTE — ED Triage Notes (Signed)
 Pt arrived via [facility] from home c/o fall in which he fell down three steps outside and fell face first on concrete. Denies LOC, denies use of blood thinners, abrasions to forehead, nose, and L hand. C/o L side rib pain 10/10, equal chest expansion noted

## 2024-04-23 ENCOUNTER — Other Ambulatory Visit: Payer: Self-pay | Admitting: Urology

## 2024-05-31 ENCOUNTER — Other Ambulatory Visit (HOSPITAL_COMMUNITY): Payer: Self-pay

## 2024-06-01 NOTE — Progress Notes (Signed)
 COVID Vaccine received:  []  No [x]  Yes Date of any COVID positive Test in last 90 days: no PCP - Dorn Sauers MD Cardiologist - n/a  Chest x-ray - CT chest 03/06/24 Epic EKG -  03/06/24 Epic Stress Test -  ECHO -  Cardiac Cath -   Bowel Prep - [x]  No  []   Yes ______  Pacemaker / ICD device [x]  No []  Yes   Spinal Cord Stimulator:[x]  No []  Yes       History of Sleep Apnea? [x]  No []  Yes   CPAP used?- [x]  No []  Yes    Does the patient monitor blood sugar?          [x]  No []  Yes  []  N/A  Patient has: [x]  NO Hx DM   []  Pre-DM                 []  DM1  []   DM2 Does patient have a [employer] or Dexacom? []  No []  Yes   Fasting Blood Sugar Ranges-  Checks Blood Sugar _____ times a day  GLP1 agonist / usual dose - no GLP1 instructions:  SGLT-2 inhibitors / usual dose - no SGLT-2 instructions:   Blood Thinner / Instructions:no Aspirin Instructions:no  Comments:   Activity level: Patient is able to climb a flight of stairs without difficulty; [x]  No CP  [x]  No SOB,   Patient can perform ADLs without assistance.   Anesthesia review: CAD, HTN, MI  Patient denies shortness of breath, fever, cough and chest pain at PAT appointment.  Patient verbalized understanding and agreement to the Pre-Surgical Instructions that were given to them at this PAT appointment. Patient was also educated of the need to review these PAT instructions again prior to his/her surgery.I reviewed the appropriate phone numbers to call if they have any and questions or concerns.

## 2024-06-01 NOTE — Patient Instructions (Signed)
 SURGICAL WAITING ROOM VISITATION  Patients having surgery or a procedure may have no more than 2 support people in the waiting area - these visitors may rotate.    Children under the age of 19 must have an adult with them who is not the patient.  Visitors with respiratory illnesses are discouraged from visiting and should remain at home.  If the patient needs to stay at the hospital during part of their recovery, the visitor guidelines for inpatient rooms apply. Pre-op nurse will coordinate an appropriate time for 1 support person to accompany patient in pre-op.  This support person may not rotate.    Please refer to the [facility] website for the visitor guidelines for Inpatients (after your surgery is over and you are in a regular room).       Your procedure is scheduled on: 06/15/24   Report to [facility] Main Entrance    Report to admitting at 11 AM   Call this number if you have problems the morning of surgery [phone]   Do not eat food :After Midnight.   After Midnight you may have the following liquids until 5 AM DAY OF SURGERY  Water Non-Citrus Juices (without pulp, NO RED-Apple, White grape, White cranberry) Black Coffee (NO MILK/CREAM OR CREAMERS, sugar ok)  Clear Tea (NO MILK/CREAM OR CREAMERS, sugar ok) regular and decaf                             Plain Jell-O (NO RED)                                           Fruit ices (not with fruit pulp, NO RED)                                     Popsicles (NO RED)                                                               Sports drinks like Gatorade (NO RED)                FOLLOW BOWEL PREP AND ANY ADDITIONAL PRE OP INSTRUCTIONS YOU RECEIVED FROM YOUR SURGEON'S OFFICE!!!     Oral Hygiene is also important to reduce your risk of infection.                                    Remember - BRUSH YOUR TEETH THE MORNING OF SURGERY WITH YOUR REGULAR TOOTHPASTE  DENTURES WILL BE REMOVED PRIOR TO SURGERY PLEASE DO  NOT APPLY Poly grip OR ADHESIVES!!!   Stop all vitamins and herbal supplements 7 days before surgery.   Take these medicines the morning of surgery with A SIP OF WATER: amlodipine, tylenol , simvastatin, tamsulosin.               Do not take ramipril(Altace) the morning of surgery.              You may  not have any metal on your body including hair pins, jewelry, and body piercing             Do not wear make-up, lotions, powders, perfumes/cologne, or deodorant              Men may shave face and neck.   Do not bring valuables to the hospital. [facility] IS NOT             RESPONSIBLE   FOR VALUABLES.   Contacts, glasses, dentures or bridgework may not be worn into surgery.  DO NOT BRING YOUR HOME MEDICATIONS TO THE HOSPITAL. PHARMACY WILL DISPENSE MEDICATIONS LISTED ON YOUR MEDICATION LIST TO YOU DURING YOUR ADMISSION IN THE HOSPITAL!    Patients discharged on the day of surgery will not be allowed to drive home.  Someone NEEDS to stay with you for the first 24 hours after anesthesia.   Special Instructions: Bring a copy of your healthcare power of attorney and living will documents the day of surgery if you haven't scanned them before.              Please read over the following fact sheets you were given: IF YOU HAVE QUESTIONS ABOUT YOUR PRE-OP INSTRUCTIONS PLEASE CALL [phone] Carlos Navarro   If you received a COVID test during your pre-op visit  it is requested that you wear a mask when out in public, stay away from anyone that may not be feeling well and notify your surgeon if you develop symptoms. If you test positive for Covid or have been in contact with anyone that has tested positive in the last 10 days please notify you surgeon.    [facility] - Preparing for Surgery Before surgery, you can play an important role.  Because skin is not sterile, your skin needs to be as free of germs as possible.  You can reduce the number of germs on your skin by washing with CHG  (chlorahexidine gluconate) soap before surgery.  CHG is an antiseptic cleaner which kills germs and bonds with the skin to continue killing germs even after washing. Please DO NOT use if you have an allergy to CHG or antibacterial soaps.  If your skin becomes reddened/irritated stop using the CHG and inform your nurse when you arrive at Short Stay. Do not shave (including legs and underarms) for at least 48 hours prior to the first CHG shower.  You may shave your face/neck.  Please follow these instructions carefully:  1.  Shower with CHG Soap the night before surgery and the  morning of surgery.  2.  If you choose to wash your hair, wash your hair first as usual with your normal  shampoo.  3.  After you shampoo, rinse your hair and body thoroughly to remove the shampoo.                             4.  Use CHG as you would any other liquid soap.  You can apply chg directly to the skin and wash.  Gently with a scrungie or clean washcloth.  5.  Apply the CHG Soap to your body ONLY FROM THE NECK DOWN.   Do   not use on face/ open                           Wound or open sores. Avoid contact with eyes, ears mouth and  genitals (private parts).                       Wash face,  Genitals (private parts) with your normal soap.             6.  Wash thoroughly, paying special attention to the area where your    surgery  will be performed.  7.  Thoroughly rinse your body with warm water from the neck down.  8.  DO NOT shower/wash with your normal soap after using and rinsing off the CHG Soap.                9.  Pat yourself dry with a clean towel.            10.  Wear clean pajamas.            11.  Place clean sheets on your bed the night of your first shower and do not  sleep with pets. Day of Surgery : Do not apply any lotions/deodorants the morning of surgery.  Please wear clean clothes to the hospital/surgery center.  FAILURE TO FOLLOW THESE INSTRUCTIONS MAY RESULT IN THE CANCELLATION OF YOUR  SURGERY  PATIENT SIGNATURE_________________________________  NURSE SIGNATURE__________________________________  ________________________________________________________________________

## 2024-06-01 NOTE — Progress Notes (Incomplete Revision)
 COVID Vaccine received:  []  No []  Yes Date of any COVID positive Test in last 90 days:  PCP - Dorn Sauers MD Cardiologist -   Chest x-ray - CT chest 03/06/24 Epic EKG -  03/06/24 Epic Stress Test -  ECHO -  Cardiac Cath -   Bowel Prep - []  No  []   Yes ______  Pacemaker / ICD device []  No []  Yes   Spinal Cord Stimulator:[]  No []  Yes       History of Sleep Apnea? []  No []  Yes   CPAP used?- []  No []  Yes    Does the patient monitor blood sugar?          []  No []  Yes  []  N/A  Patient has: []  NO Hx DM   []  Pre-DM                 []  DM1  []   DM2 Does patient have a [employer] or [employer]? []  No []  Yes   Fasting Blood Sugar Ranges-  Checks Blood Sugar _____ times a day  GLP1 agonist / usual dose -  GLP1 instructions:  SGLT-2 inhibitors / usual dose -  SGLT-2 instructions:   Blood Thinner / Instructions: Aspirin Instructions:  Comments:   Activity level: Patient is able / unable to climb a flight of stairs without difficulty; []  No CP  []  No SOB, but would have ___   Patient can / can not perform ADLs without assistance.   Anesthesia review:   Patient denies shortness of breath, fever, cough and chest pain at PAT appointment.  Patient verbalized understanding and agreement to the Pre-Surgical Instructions that were given to them at this PAT appointment. Patient was also educated of the need to review these PAT instructions again prior to his/her surgery.I reviewed the appropriate phone numbers to call if they have any and questions or concerns.

## 2024-06-03 ENCOUNTER — Encounter (HOSPITAL_COMMUNITY)
Admission: RE | Admit: 2024-06-03 | Discharge: 2024-06-03 | Disposition: A | Discharge status: Home / Self Care | Source: Ambulatory Visit | Attending: Urology | Admitting: Urology

## 2024-06-03 ENCOUNTER — Encounter (HOSPITAL_COMMUNITY): Payer: Self-pay

## 2024-06-03 ENCOUNTER — Other Ambulatory Visit: Payer: Self-pay

## 2024-06-03 VITALS — BP 155/94 | HR 63 | Temp 97.6°F | Resp 16 | Ht 72.0 in | Wt 182.0 lb

## 2024-06-03 DIAGNOSIS — I10 Essential (primary) hypertension: Secondary | ICD-10-CM | POA: Diagnosis not present

## 2024-06-03 DIAGNOSIS — Z01812 Encounter for preprocedural laboratory examination: Secondary | ICD-10-CM | POA: Insufficient documentation

## 2024-06-03 DIAGNOSIS — Z01818 Encounter for other preprocedural examination: Secondary | ICD-10-CM

## 2024-06-03 HISTORY — DX: Personal history of urinary calculi: Z87.442

## 2024-06-03 HISTORY — DX: Gastro-esophageal reflux disease without esophagitis: K21.9

## 2024-06-03 HISTORY — DX: Acute myocardial infarction, unspecified: I21.9

## 2024-06-03 LAB — BASIC METABOLIC PANEL WITH GFR
Anion gap: 9 (ref 5–15)
BUN: 12 mg/dL (ref 8–23)
CO2: 27 mmol/L (ref 22–32)
Calcium: 9.5 mg/dL (ref 8.9–10.3)
Chloride: 103 mmol/L (ref 98–111)
Creatinine, Ser: 0.8 mg/dL (ref 0.61–1.24)
GFR, Estimated: >60 mL/min (ref >60)
Glucose, Bld: 108 mg/dL — ABNORMAL HIGH (ref 70–99)
Potassium: 4.2 mmol/L (ref 3.5–5.1)
Sodium: 139 mmol/L (ref 135–145)

## 2024-06-03 LAB — CBC
HCT: 38.3 % — ABNORMAL LOW (ref 39.0–52.0)
Hemoglobin: 12.4 g/dL — ABNORMAL LOW (ref 13.0–17.0)
MCH: 32.9 pg (ref 26.0–34.0)
MCHC: 32.4 g/dL (ref 30.0–36.0)
MCV: 101.6 fL — ABNORMAL HIGH (ref 80.0–100.0)
Platelets: 190 10*3/uL (ref 150–400)
RBC: 3.77 MIL/uL — ABNORMAL LOW (ref 4.22–5.81)
RDW: 14 % (ref 11.5–15.5)
WBC: 5.4 10*3/uL (ref 4.0–10.5)
nRBC: 0 % (ref 0.0–0.2)

## 2024-06-09 NOTE — Progress Notes (Addendum)
 Anesthesia Chart Review   Case: [id number] Date/Time: 06/15/24 1301   Procedure: ABLATION, PROSTATE, TRANSURETHRAL, USING WATERJET   Anesthesia type: General   Diagnosis: Hyperplasia of prostate with urinary obstruction [N40.1, N13.8]   Pre-op diagnosis: BENIGN PROSTATIC HYPERPLASIA   Location: [facility] PROC[facility] / [facility]   Surgeons: Carlos Cough, MD       DISCUSSION:88 y.o. never smoker with h/o HTN, CAD s/p stent to RCA 1999, BPH scheduled for above procedure 06/15/2024 with Dr. Cough Carlos.   H/o MI with stent to RCA in 1999.  He has followed with PCP for this over the last few years.  He had a stress test in 2011 with moderate to severe perfusion defect due to infarct/scar, unchanged from 2006 study per results. Pt reports he can climb a flight of stairs without difficulty, no chest pain or shortness of breath.   Pt last seen by PCP 01/06/2024. BP stable on amlodipine and ramipril.  CAD stable, without sx, HLD managed on ezetimibe.   Discussed with Dr. Maryclare, ok to proceed.  VS: BP (!) 155/94   Pulse 63   Temp 36.4 C (Oral)   Resp 16   Ht 6' (1.829 m)   Wt 82.6 kg   SpO2 100%   BMI 24.68 kg/m   PROVIDERS: Charlott Dorn LABOR, MD is PCP    LABS: Labs reviewed: Acceptable for surgery. (all labs ordered are listed, but only abnormal results are displayed)  Labs Reviewed  BASIC METABOLIC PANEL WITH GFR - Abnormal; Notable for the following components:      Result Value   Glucose, Bld 108 (*)    All other components within normal limits  CBC - Abnormal; Notable for the following components:   RBC 3.77 (*)    Hemoglobin 12.4 (*)    HCT 38.3 (*)    MCV 101.6 (*)    All other components within normal limits     IMAGES:   EKG:   CV:  Past Medical History:  Diagnosis Date   Adenomatous colon polyp    ASHD (arteriosclerotic heart disease)    S/P DMI in oct 1999 w/ stent placed in the RCA   BPH (benign prostatic hyperplasia)    Cervical disc disease     Dermatitis    on hands   Diverticulosis    DJD (degenerative joint disease)    knees   Dumping syndrome    secondary   ED (erectile dysfunction)    Epididymitis    on the left   GERD (gastroesophageal reflux disease)    H/O vagotomy    History of kidney stones    HTN (hypertension)    Hypercholesterolemia    Kidney stone on right side    Low back pain    Myocardial infarction (HCC)    Odynophagia    chronic w/ chest burning   Peripheral edema    Tinnitus    Ventral hernia     Past Surgical History:  Procedure Laterality Date   ANTRECTOMY     APPENDECTOMY     BACK SURGERY     [place]th I anastomosis     CERVICAL DISC SURGERY     x 2   CHOLECYSTECTOMY     COLONOSCOPY WITH PROPOFOL  N/A 02/21/2015   Procedure: COLONOSCOPY WITH PROPOFOL ;  Surgeon: Gladis MARLA Louder, MD;  Location: [facility];  Service: Endoscopy;  Laterality: N/A;   HEMORRHOID SURGERY     repair of spinal column  2009   punctured spinal cord  and leaking spinal fluid-had to repair   TONSILLECTOMY      MEDICATIONS:  acetaminophen  (TYLENOL ) 500 MG tablet   amLODipine (NORVASC) 2.5 MG tablet   ezetimibe (ZETIA) 10 MG tablet   Glucosamine-Chondroitin (COSAMIN DS PO)   ketotifen (ZADITOR) 0.035 % ophthalmic solution   Multiple Vitamin (MULTIVITAMIN WITH MINERALS) TABS tablet   ramipril (ALTACE) 10 MG capsule   vitamin B-12 (CYANOCOBALAMIN ) 500 MCG tablet   No current facility-administered medications for this encounter.    Harlene Hoots Ward, PA-C [facility] Pre-Surgical Testing [phone]

## 2024-06-15 ENCOUNTER — Encounter (HOSPITAL_COMMUNITY)
Admission: RE | Disposition: A | Discharge status: Home / Self Care | Payer: Self-pay | Source: Ambulatory Visit | Attending: Urology

## 2024-06-15 ENCOUNTER — Ambulatory Visit (HOSPITAL_COMMUNITY): Admitting: Anesthesiology

## 2024-06-15 ENCOUNTER — Encounter (HOSPITAL_COMMUNITY): Payer: Self-pay | Admitting: Urology

## 2024-06-15 ENCOUNTER — Ambulatory Visit (HOSPITAL_COMMUNITY): Payer: Self-pay | Admitting: Physician Assistant

## 2024-06-15 ENCOUNTER — Ambulatory Visit (HOSPITAL_COMMUNITY)
Admission: RE | Admit: 2024-06-15 | Discharge: 2024-06-15 | Disposition: A | Discharge status: Home / Self Care | Source: Ambulatory Visit | Attending: Urology | Admitting: Urology

## 2024-06-15 DIAGNOSIS — I252 Old myocardial infarction: Secondary | ICD-10-CM | POA: Diagnosis not present

## 2024-06-15 DIAGNOSIS — R3912 Poor urinary stream: Secondary | ICD-10-CM | POA: Diagnosis present

## 2024-06-15 DIAGNOSIS — Z955 Presence of coronary angioplasty implant and graft: Secondary | ICD-10-CM | POA: Insufficient documentation

## 2024-06-15 DIAGNOSIS — N401 Enlarged prostate with lower urinary tract symptoms: Secondary | ICD-10-CM | POA: Diagnosis present

## 2024-06-15 DIAGNOSIS — R350 Frequency of micturition: Secondary | ICD-10-CM | POA: Diagnosis present

## 2024-06-15 DIAGNOSIS — I2510 Atherosclerotic heart disease of native coronary artery without angina pectoris: Secondary | ICD-10-CM | POA: Insufficient documentation

## 2024-06-15 DIAGNOSIS — N138 Other obstructive and reflux uropathy: Secondary | ICD-10-CM

## 2024-06-15 DIAGNOSIS — I10 Essential (primary) hypertension: Secondary | ICD-10-CM | POA: Diagnosis not present

## 2024-06-15 SURGERY — ABLATION, PROSTATE, TRANSURETHRAL, USING WATERJET
Anesthesia: General | Site: Prostate

## 2024-06-15 MED ORDER — FENTANYL CITRATE (PF) 100 MCG/2ML IJ SOLN
INTRAMUSCULAR | Status: DC | PRN
  Administered 2024-06-15 (×2): 25 ug via INTRAVENOUS
  Administered 2024-06-15: 50 ug via INTRAVENOUS

## 2024-06-15 MED ORDER — SULFAMETHOXAZOLE-TRIMETHOPRIM 800-160 MG PO TABS
1.0000 | ORAL_TABLET | Freq: Every day | ORAL | 0 refills | Status: AC
Start: 2024-06-15 — End: ?

## 2024-06-15 MED ORDER — DEXAMETHASONE SODIUM PHOSPHATE 10 MG/ML IJ SOLN
INTRAMUSCULAR | Status: AC
  Filled 2024-06-15: qty 1

## 2024-06-15 MED ORDER — DEXAMETHASONE SODIUM PHOSPHATE 10 MG/ML IJ SOLN
INTRAMUSCULAR | Status: DC | PRN
Start: 2024-06-15 — End: 2024-06-15
  Administered 2024-06-15: 5 mg via INTRAVENOUS

## 2024-06-15 MED ORDER — ORAL CARE MOUTH RINSE: 15.0000 mL | Freq: Once | OROMUCOSAL | Status: AC

## 2024-06-15 MED ORDER — LIDOCAINE HCL (PF) 2 % IJ SOLN
INTRAMUSCULAR | Status: AC
  Filled 2024-06-15: qty 5

## 2024-06-15 MED ORDER — ROCURONIUM BROMIDE 10 MG/ML (PF) SYRINGE
PREFILLED_SYRINGE | INTRAVENOUS | Status: AC
Start: 2024-06-15 — End: 2024-06-15
  Filled 2024-06-15: qty 10

## 2024-06-15 MED ORDER — SUGAMMADEX SODIUM 200 MG/2ML IV SOLN
INTRAVENOUS | Status: DC | PRN
Start: 2024-06-15 — End: 2024-06-15
  Administered 2024-06-15: 200 mg via INTRAVENOUS

## 2024-06-15 MED ORDER — TRANEXAMIC ACID-NACL 1000-0.7 MG/100ML-% IV SOLN
1000.0000 mg | INTRAVENOUS | Status: AC
  Administered 2024-06-15: 1000 mg via INTRAVENOUS
  Filled 2024-06-15: qty 100

## 2024-06-15 MED ORDER — FENTANYL CITRATE PF 50 MCG/ML IJ SOSY: 25.0000 ug | PREFILLED_SYRINGE | INTRAMUSCULAR | Status: DC | PRN

## 2024-06-15 MED ORDER — ONDANSETRON HCL 4 MG/2ML IJ SOLN
INTRAMUSCULAR | Status: DC | PRN
  Administered 2024-06-15: 4 mg via INTRAVENOUS

## 2024-06-15 MED ORDER — ROCURONIUM BROMIDE 100 MG/10ML IV SOLN
INTRAVENOUS | Status: DC | PRN
  Administered 2024-06-15: 20 mg via INTRAVENOUS
  Administered 2024-06-15: 40 mg via INTRAVENOUS

## 2024-06-15 MED ORDER — SODIUM CHLORIDE 0.9 % IV SOLN
2.0000 g | INTRAVENOUS | Status: AC
  Administered 2024-06-15: 2 g via INTRAVENOUS
  Filled 2024-06-15: qty 20

## 2024-06-15 MED ORDER — PROPOFOL 10 MG/ML IV BOLUS
INTRAVENOUS | Status: AC
Start: 2024-06-15 — End: 2024-06-15
  Filled 2024-06-15: qty 20

## 2024-06-15 MED ORDER — STERILE WATER FOR IRRIGATION IR SOLN
Status: DC | PRN
  Administered 2024-06-15: 1000 mL

## 2024-06-15 MED ORDER — LIDOCAINE HCL (CARDIAC) PF 100 MG/5ML IV SOSY
PREFILLED_SYRINGE | INTRAVENOUS | Status: DC | PRN
Start: 2024-06-15 — End: 2024-06-15
  Administered 2024-06-15: 60 mg via INTRAVENOUS

## 2024-06-15 MED ORDER — FENTANYL CITRATE (PF) 100 MCG/2ML IJ SOLN
INTRAMUSCULAR | Status: AC
Start: 2024-06-15 — End: 2024-06-15
  Filled 2024-06-15: qty 2

## 2024-06-15 MED ORDER — LACTATED RINGERS IV SOLN: INTRAVENOUS | Status: DC

## 2024-06-15 MED ORDER — ONDANSETRON HCL 4 MG/2ML IJ SOLN
INTRAMUSCULAR | Status: AC
Start: 2024-06-15 — End: 2024-06-15
  Filled 2024-06-15: qty 2

## 2024-06-15 MED ORDER — AMISULPRIDE (ANTIEMETIC) 5 MG/2ML IV SOLN: 10.0000 mg | Freq: Once | INTRAVENOUS | Status: DC | PRN

## 2024-06-15 MED ORDER — ONDANSETRON HCL 4 MG/2ML IJ SOLN: 4.0000 mg | Freq: Once | INTRAMUSCULAR | Status: DC | PRN

## 2024-06-15 MED ORDER — CHLORHEXIDINE GLUCONATE 0.12 % MT SOLN
15.0000 mL | Freq: Once | OROMUCOSAL | Status: AC
  Administered 2024-06-15: 15 mL via OROMUCOSAL

## 2024-06-15 MED ORDER — 0.9 % SODIUM CHLORIDE (POUR BTL) OPTIME
TOPICAL | Status: DC | PRN
  Administered 2024-06-15: 1000 mL

## 2024-06-15 MED ORDER — ACETAMINOPHEN 10 MG/ML IV SOLN
1000.0000 mg | Freq: Once | INTRAVENOUS | Status: DC | PRN
Start: 2024-06-15 — End: 2024-06-16

## 2024-06-15 MED ORDER — PROPOFOL 10 MG/ML IV BOLUS
INTRAVENOUS | Status: DC | PRN
  Administered 2024-06-15: 100 mg via INTRAVENOUS

## 2024-06-15 MED ORDER — SODIUM CHLORIDE 0.9 % IR SOLN
Status: DC | PRN
  Administered 2024-06-15: 6000 mL

## 2024-06-15 SURGICAL SUPPLY — 27 items
BAG URINE DRAIN 2000ML AR STRL (UROLOGICAL SUPPLIES) ×1 IMPLANT
BAND RUBBER #18 3X1/16 STRL (MISCELLANEOUS) IMPLANT
CATH HEMA 3WAY 30CC 22FR COUDE (CATHETERS) IMPLANT
CATH HEMA 3WAY 30CC 24FR COUDE (CATHETERS) IMPLANT
CATH HEMATURIA 20FR (CATHETERS) IMPLANT
COVER MAYO STAND STRL (DRAPES) ×1 IMPLANT
DRAPE FOOT SWITCH (DRAPES) ×1 IMPLANT
DRAPE SURG IRRIG POUCH 19X23 (DRAPES) IMPLANT
GEL ULTRASOUND 8.5O AQUASONIC (MISCELLANEOUS) ×1 IMPLANT
GLOVE SURG LX STRL 7.5 STRW (GLOVE) ×1 IMPLANT
GOWN STRL REUS W/ TWL XL LVL3 (GOWN DISPOSABLE) ×1 IMPLANT
HANDPIECE AQUABEAM (MISCELLANEOUS) ×1 IMPLANT
HOLDER FOLEY CATH W/STRAP (MISCELLANEOUS) IMPLANT
KIT TURNOVER KIT A (KITS) ×1 IMPLANT
LOOP CUT BIPOLAR 24F LRG (ELECTROSURGICAL) IMPLANT
MANIFOLD NEPTUNE II (INSTRUMENTS) ×1 IMPLANT
MAT ABSORB FLUID 56X50 GRAY (MISCELLANEOUS) ×1 IMPLANT
PACK CYSTO (CUSTOM PROCEDURE TRAY) ×1 IMPLANT
PACK DRAPE AQUABEAM (MISCELLANEOUS) ×1 IMPLANT
PAD PREP 24X48 CUFFED NSTRL (MISCELLANEOUS) ×1 IMPLANT
PIN SAFETY STERILE (MISCELLANEOUS) IMPLANT
SYR 30ML LL (SYRINGE) ×1 IMPLANT
SYRINGE TOOMEY IRRIG 70ML (MISCELLANEOUS) ×2 IMPLANT
TOWEL OR 17X26 10 PK STRL BLUE (TOWEL DISPOSABLE) ×1 IMPLANT
TUBING CONNECTING 10 (TUBING) ×2 IMPLANT
TUBING UROLOGY SET (TUBING) ×1 IMPLANT
UNDERPAD 30X36 HEAVY ABSORB (UNDERPADS AND DIAPERS) ×1 IMPLANT

## 2024-06-15 NOTE — Transfer of Care (Signed)
 Immediate Anesthesia Transfer of Care Note  Patient: Carlos Navarro  Procedure(s) Performed: ABLATION, PROSTATE, TRANSURETHRAL, USING WATERJET (Prostate)  Patient Location: PACU  Anesthesia Type:General  Level of Consciousness: awake, alert , and oriented  Airway & Oxygen Therapy: Patient Spontanous Breathing and Patient connected to nasal cannula oxygen  Post-op Assessment: Report given to RN and Post -op Vital signs reviewed and stable  Post vital signs: Reviewed and stable  Last Vitals:  Vitals Value Taken Time  BP 142/113 06/15/24 16:04  Temp    Pulse 65 06/15/24 16:05  Resp 14 06/15/24 16:05  SpO2 100 % 06/15/24 16:05  Vitals shown include unfiled device data.  Last Pain:  Vitals:   06/15/24 1200  TempSrc:   PainSc: 0-No pain      Patients Stated Pain Goal: 4 (06/15/24 1200)  Complications: No notable events documented.

## 2024-06-15 NOTE — Discharge Instructions (Signed)
Robotic water jet ablation of the Prostate, Care After The following information offers guidance on how to care for yourself after your procedure. Your health care provider may also give you more specific instructions. If you have problems or questions, contact your health care provider. What can I expect after the procedure? After the procedure, it is common to have: Mild pain in your lower abdomen. Soreness or mild discomfort in your penis or when you urinate. This is from having the catheter inserted during the procedure. A sudden urge to urinate (urgency). A need to urinate often. A small amount of blood in your urine. You may notice some small blood clots in your urine. These are normal. Follow these instructions at home: Medicines Take over-the-counter and prescription medicines only as told by your health care provider. If you were prescribed an antibiotic medicine, take it as told by your health care provider. Do not stop taking the antibiotic even if you start to feel better. Activity  Rest as told by your health care provider. Avoid sitting for a long time without moving. Get up to take short walks every 1-2 hours. This is important to improve blood flow and breathing. Ask for help if you feel weak or unsteady. You may increase your physical activity gradually as you start to feel better. Do not drive or operate machinery until your health care provider says that it is safe. Do not ride in a car for long periods of time, or as told by your health care provider. Avoid intense physical activity for as long as told by your health care provider. Do not lift anything that is heavier than 10 lb (4.5 kg), or the limit that you are told, until your health care provider says that it is safe. Do not have sex until your health care provider approves. Return to your normal activities as told by your health care provider. Ask your health care provider what activities are safe for you. Preventing  constipation  You may need to take these actions to prevent or treat constipation: Drink enough fluid to keep your urine pale yellow. Take over-the-counter or prescription medicines. Eat foods that are high in fiber, such as beans, whole grains, and fresh fruits and vegetables. Limit foods that are high in fat and processed sugars, such as fried or sweet foods.   General instructions Do not strain when you have a bowel movement. Straining may lead to bleeding from the prostate. This may cause blood clots and trouble urinating. Do not use any products that contain nicotine or tobacco. These products include cigarettes, chewing tobacco, and vaping devices, such as e-cigarettes. If you need help quitting, ask your health care provider. If you go home with a tube draining your urine (urinary catheter), care for the catheter as told by your health care provider. Wear compression stockings as told by your health care provider. These stockings help to prevent blood clots and reduce swelling in your legs. Keep all follow-up visits. This is important. Contact a health care provider if: You have signs of infection, such as: Fever or chills. Urine that smells very bad. Swelling around your urethra that is getting worse. Swelling in your penis or testicles. You have difficulty urinating. You have pain that gets worse or does not improve with medicine. You have blood in your urine that does not go away after 1 week of resting and drinking more fluids. You have trouble having a bowel movement. You have trouble having or keeping an erection. No  semen comes out during orgasm (dry ejaculation). You have a urinary catheter in place, and you have: Spasms or pain. Problems with your catheter or your catheter is blocked. Get help right away if: You are unable to urinate. You are having more blood clots in your urine instead of fewer. You have: Large blood clots. A lot of blood in your urine. Pain in  your back or lower abdomen. You have difficulty breathing or shortness of breath. You develop swelling or pain in your leg. These symptoms may be an emergency. Get help right away. Call 911. Do not wait to see if the symptoms will go away. Do not drive yourself to the hospital. Summary After the procedure, it is common to have a small amount of blood in your urine. Follow restrictions about lifting and sexual activity as told by your health care provider. Ask what activities are safe for you. Keep all follow-up visits. This is important. This information is not intended to replace advice given to you by your health care provider. Make sure you discuss any questions you have with your health care provider. Document Revised: 08/21/2021 Document Reviewed: 08/21/2021 Elsevier Patient Education  2024 [employer].

## 2024-06-15 NOTE — Anesthesia Preprocedure Evaluation (Signed)
 Anesthesia Evaluation  Patient identified by MRN, date of birth, ID band Patient awake    Reviewed: Allergy & Precautions, NPO status , Patient's Chart, lab work & pertinent test results  Airway Mallampati: II  TM Distance: >3 FB Neck ROM: Full    Dental  (+) Missing   Pulmonary    Pulmonary exam normal        Cardiovascular hypertension, Pt. on medications + CAD, + Past MI and + Cardiac Stents (x 1)  Normal cardiovascular exam     Neuro/Psych negative neurological ROS  negative psych ROS   GI/Hepatic negative GI ROS, Neg liver ROS,,,  Endo/Other  negative endocrine ROS    Renal/GU Renal disease     Musculoskeletal  (+) Arthritis ,    Abdominal   Peds  Hematology  (+) Blood dyscrasia, anemia   Anesthesia Other Findings BENIGN PROSTATIC HYPERPLASIA  Reproductive/Obstetrics                              Anesthesia Physical Anesthesia Plan  ASA: 3  Anesthesia Plan: General   Post-op Pain Management:    Induction: Intravenous  PONV Risk Score and Plan: 2 and Ondansetron , Dexamethasone  and Treatment may vary due to age or medical condition  Airway Management Planned: Oral ETT  Additional Equipment:   Intra-op Plan:   Post-operative Plan: Extubation in OR  Informed Consent: I have reviewed the patients History and Physical, chart, labs and discussed the procedure including the risks, benefits and alternatives for the proposed anesthesia with the patient or authorized representative who has indicated his/her understanding and acceptance.     Dental advisory given  Plan Discussed with: CRNA  Anesthesia Plan Comments:         Anesthesia Quick Evaluation

## 2024-06-15 NOTE — Anesthesia Postprocedure Evaluation (Signed)
 Anesthesia Post Note  Patient: Carlos Navarro  Procedure(s) Performed: ABLATION, PROSTATE, TRANSURETHRAL, USING WATERJET (Prostate)     Patient location during evaluation: PACU Anesthesia Type: General Level of consciousness: awake Pain management: pain level controlled Vital Signs Assessment: post-procedure vital signs reviewed and stable Respiratory status: spontaneous breathing, nonlabored ventilation and respiratory function stable Cardiovascular status: blood pressure returned to baseline and stable Postop Assessment: no apparent nausea or vomiting Anesthetic complications: no   No notable events documented.  Last Vitals:  Vitals:   06/15/24 1741 06/15/24 1745  BP:  124/72  Pulse: 63 64  Resp: (!) 26 14  Temp:    SpO2: 99% 99%    Last Pain:  Vitals:   06/15/24 1745  TempSrc:   PainSc: 0-No pain                 Artemis Loyal P Lupe Handley

## 2024-06-15 NOTE — H&P (Signed)
 H&P  Chief Complaint: BPH with lower urinary tract symptoms  History of Present Illness: Carlos Navarro is an 88 year old male with a history of BPH and lower urinary tract symptoms.  He mainly complains of weak stream and on and off dysuria.  Cystoscopy was benign with lateral lobe obstruction.  Prostate measured about 100 g on CT in 2014.  He started finasteride in 2023 and follow-up prostate ultrasound revealed about a 70 g prostate.  Uroflow was 4.6 cc/s.  His UA has been negative.  Culture is negative.  He does not want to preserve ejaculation but maximize treatment success.  He has been well without recent dysuria.  No gross hematuria.  No fever.  No bladder pain.  No cough or congestion.  Past Medical History:  Diagnosis Date   Adenomatous colon polyp    ASHD (arteriosclerotic heart disease)    S/P DMI in oct 1999 w/ stent placed in the RCA   BPH (benign prostatic hyperplasia)    Cervical disc disease    Dermatitis    on hands   Diverticulosis    DJD (degenerative joint disease)    knees   Dumping syndrome    secondary   ED (erectile dysfunction)    Epididymitis    on the left   GERD (gastroesophageal reflux disease)    H/O vagotomy    History of kidney stones    HTN (hypertension)    Hypercholesterolemia    Kidney stone on right side    Low back pain    Myocardial infarction (HCC)    Odynophagia    chronic w/ chest burning   Peripheral edema    Tinnitus    Ventral hernia    Past Surgical History:  Procedure Laterality Date   ANTRECTOMY     APPENDECTOMY     BACK SURGERY     Billroth I anastomosis     CERVICAL DISC SURGERY     x 2   CHOLECYSTECTOMY     COLONOSCOPY WITH PROPOFOL  N/A 02/21/2015   Procedure: COLONOSCOPY WITH PROPOFOL ;  Surgeon: Gladis MARLA Louder, MD;  Location: [facility];  Service: Endoscopy;  Laterality: N/A;   HEMORRHOID SURGERY     repair of spinal column  2009   punctured spinal cord and leaking spinal fluid-had to repair   TONSILLECTOMY      Home  Medications:  Medications Prior to Admission  Medication Sig Dispense Refill Last Dose/Taking   acetaminophen  (TYLENOL ) 500 MG tablet Take 500 mg by mouth in the morning and at bedtime.   06/15/2024 at  6:00 AM   amLODipine (NORVASC) 2.5 MG tablet Take 2.5 mg by mouth at bedtime.   06/14/2024 Bedtime   ezetimibe (ZETIA) 10 MG tablet Take 10 mg by mouth at bedtime.   06/13/2024   Glucosamine-Chondroitin (COSAMIN DS PO) Take 1 tablet by mouth in the morning and at bedtime.   06/13/2024   ketotifen (ZADITOR) 0.035 % ophthalmic solution Place 1 drop into both eyes in the morning and at bedtime.   06/14/2024 Bedtime   Multiple Vitamin (MULTIVITAMIN WITH MINERALS) TABS tablet Take 1 tablet by mouth daily. Men's Multivitamin   06/08/2024   ramipril (ALTACE) 10 MG capsule Take 20 mg by mouth in the morning.   06/14/2024 Morning   vitamin B-12 (CYANOCOBALAMIN ) 500 MCG tablet Take 500 mcg by mouth at bedtime.   06/08/2024   Allergies:  Allergies  Allergen Reactions   Codeine Other (See Comments)    spasm   Pravastatin Sodium Rash  Joint pain, dehydration     Family History  Problem Relation Age of Onset   CAD Mother    Cancer - Colon Brother    Alzheimer's disease Brother    Cancer Sister        breast   Gout Sister    Diabetes Sister    CAD Sister    Social History:  reports that he has never smoked. He does not have any smokeless tobacco history on file. He reports current alcohol use of about 1.0 standard drink of alcohol per week. He reports that he does not use drugs.  ROS: A complete review of systems was performed.  All systems are negative except for pertinent findings as noted. Review of Systems  All other systems reviewed and are negative.    Physical Exam:  Vital signs in last 24 hours: Temp:  [97.6 F (36.4 C)] 97.6 F (36.4 C) (07/08 1115) Pulse Rate:  [58] 58 (07/08 1115) Resp:  [16] 16 (07/08 1115) BP: (146)/(75) 146/75 (07/08 1115) SpO2:  [97 %] 97 % (07/08 1115) Weight:   [82.6 kg] 82.6 kg (07/08 1115) General:  Alert and oriented, No acute distress HEENT: Normocephalic, atraumatic Cardiovascular: Regular rate and rhythm Lungs: Regular rate and effort Abdomen: Soft, nontender, nondistended, no abdominal masses Back: No CVA tenderness Extremities: No edema Neurologic: Grossly intact  Laboratory Data:  No results found for this or any previous visit (from the past 24 hours). No results found for this or any previous visit (from the past 240 hours). Creatinine: No results for input(s): CREATININE in the last 168 hours.  Impression/Assessment/plan: I discussed with the patient and his daughter the nature, potential benefits, risks and alternatives to robotic water  jet ablation of the prostate, including side effects of the proposed treatment, the likelihood of the patient achieving the goals of the procedure, and any potential problems that might occur during the procedure or recuperation.  I drew them a picture of the anatomy.  We again discussed expectations for flow versus storage symptoms.  Also potential etiologies and potential for dysuria to improve or persist.  All questions answered. Patient elects to proceed.   Donnice Brooks 06/15/2024

## 2024-06-15 NOTE — Anesthesia Procedure Notes (Signed)
 Procedure Name: Intubation Date/Time: 06/15/2024 2:30 PM  Performed by: Cena Epps, CRNAPre-anesthesia Checklist: Patient identified, Emergency Drugs available, Suction available and Patient being monitored Patient Re-evaluated:Patient Re-evaluated prior to induction Oxygen Delivery Method: Circle System Utilized Preoxygenation: Pre-oxygenation with 100% oxygen Induction Type: IV induction Ventilation: Mask ventilation without difficulty Laryngoscope Size: Glidescope and 4 (limited neck ROM) Grade View: Grade I Tube type: Oral Tube size: 7.5 mm Number of attempts: 1 Airway Equipment and Method: Stylet and Oral airway Placement Confirmation: ETT inserted through vocal cords under direct vision, positive ETCO2 and breath sounds checked- equal and bilateral Secured at: 23 cm Tube secured with: Tape Dental Injury: Teeth and Oropharynx as per pre-operative assessment

## 2024-06-15 NOTE — Op Note (Signed)
 Preoperative diagnosis: BPH with lower urinary tract symptoms, weak stream, frequency  Postoperative diagnosis: Same   Procedure: Robotic water  jet ablation of the prostate   Surgeon: Nieves   Anesthesia: General   Indication for procedure:   Findings:  EUA - prostate 50 g and smooth without hard area or nodule  Cystoscopy revealed high BN and obstructing lateral lobes   Description of procedure:  He was brought to the operating room and placed supine on the operating table.  After adequate anesthesia he was placed lithotomy position. Timeout was performed to confirm the patient and procedure. The TRUS Stepper was mounted to the Articulating Arm and secured to OR bed. The ultrasound probe was attached to the stepper. Exam under anesthesia was performed and the TRUS was inserted per rectum.  There was no resistance. The ultrasound probe was aligned, and confirmation made that the prostate is centered and aligned using both transverse and sagittal views. The bladder neck, verumontanum and the central/transition zones were identified.  Genitalia were prepped and draped in the usual sterile fashion. The 65F AQUABEAM Handpiece is inserted into the prostatic urethra and a complete cystoscopic evaluation was performed by inspecting the prostate, bladder, and identifying the location of the verumontanum/external sphincter. The AQUABEAM Handpiece was secured to the Handpiece Articulating Arm. Confirmed alignment of AQUABEAM Handpiece and TRUS Probe to be parallel and colinear. Confirmation that AQUABEAM nozzle is centered and anterior of the bladder neck or the median lobe. The cystoscope was then retracted to visualize the verumontanum and external sphincter and the cystoscope tip was positioned just proximal to the external sphincter. Reconfirmed alignment of the TRUS probe with the AQUABEAM Handpiece and compression applied with TRUS probe. Horizontal alignment of the Handpiece waterjet nozzle was  performed. The Aquablation treatment zones were planned utilizing real-time TRUS to visualize the contour of the prostate and the depth and radial angles of resection were defined in the transverse view. In the sagittal view, the AQUABEAM nozzle is identified and position registered with software. The treatment contours were then adjusted to conform to the intended resection margins. The median lobe, bladder neck and verumontanum were marked and confirmed in the treatment contour. The Aquablation Treatment was then started following the resection contour confirmed under ultrasound guidance.  TOTAL AQUABLATION RESECTION TIME: 2:45, 2:17   Once Aquablation resection was complete the 24 French aqua beam handpiece was carefully removed.  The continuous-flow sheath with the visual obturator was passed and then the loop and handle.  The trigone and the ureteral orifices were identified.  Resection of some of the residual median lobe and bladder neck tissue was done.  The bladder neck was identified at 6:00 and this was taken up to 12:00 with fulguration of the bladder neck and prostate for hemostasis on the right.  Slight amount of anterior tissue was resected.  Similarly from 6:00 up to 12:00 on the left side of the bladder neck was identified by resecting some of the ablated tissue to identify the bladder neck and cauterize any bleeding.  Some anterior tissue on the left was resected.  This created excellent hemostasis.  All the chips were evacuated.  Ureteral orifices again identified and noted to be normal without injury.  The scope was backed out and a 20 [place] hematuria catheter was placed with 30 cc in the balloon.  The balloon was seated at the bladder neck and it was irrigated on light traction and noted to be clear to pink.  He was hooked up to  CBI.  He was cleaned up and placed supine.  Catheter was placed on traction.  He was awakened and taken to the cover room in stable condition.  Complications:  None  Blood loss: 50 mL  Specimens: None  Drains: 20 French three-way hematuria catheter with 30 cc in the balloon  Disposition: Patient stable to PACU

## 2024-07-21 ENCOUNTER — Other Ambulatory Visit (HOSPITAL_COMMUNITY): Payer: Self-pay | Admitting: Internal Medicine

## 2024-07-21 ENCOUNTER — Ambulatory Visit (HOSPITAL_COMMUNITY)
Admission: RE | Admit: 2024-07-21 | Discharge: 2024-07-21 | Disposition: A | Discharge status: Home / Self Care | Source: Ambulatory Visit | Attending: Vascular Surgery | Admitting: Vascular Surgery

## 2024-07-21 DIAGNOSIS — R600 Localized edema: Secondary | ICD-10-CM | POA: Insufficient documentation
